# Patient Record
Sex: Female | Born: 1935 | Race: Black or African American | Hispanic: No | Marital: Married | Smoking: Former smoker
Health system: Southern US, Community
[De-identification: ages and names within clinical notes are randomized; demographics above are authoritative.]

## PROBLEM LIST (undated history)

## (undated) DIAGNOSIS — J45909 Unspecified asthma, uncomplicated: Secondary | ICD-10-CM

## (undated) HISTORY — DX: Unspecified asthma, uncomplicated: J45.909

## (undated) HISTORY — PX: KNEE SURGERY: SHX244

## (undated) HISTORY — PX: OTHER SURGICAL HISTORY: SHX169

## (undated) HISTORY — PX: ROTATOR CUFF REPAIR: SHX139

## (undated) HISTORY — PX: ABDOMINAL HYSTERECTOMY: SHX81

## (undated) HISTORY — PX: BREAST EXCISIONAL BIOPSY: SUR124

---

## 2011-05-01 ENCOUNTER — Other Ambulatory Visit: Payer: Self-pay | Admitting: Internal Medicine

## 2011-05-01 DIAGNOSIS — Z1231 Encounter for screening mammogram for malignant neoplasm of breast: Secondary | ICD-10-CM

## 2011-05-27 ENCOUNTER — Ambulatory Visit
Admission: RE | Admit: 2011-05-27 | Discharge: 2011-05-27 | Disposition: A | Discharge status: Home / Self Care | Payer: Medicare Other | Source: Ambulatory Visit | Attending: Internal Medicine | Admitting: Internal Medicine

## 2011-05-27 DIAGNOSIS — Z1231 Encounter for screening mammogram for malignant neoplasm of breast: Secondary | ICD-10-CM

## 2012-04-23 ENCOUNTER — Other Ambulatory Visit: Payer: Self-pay | Admitting: Internal Medicine

## 2012-04-23 DIAGNOSIS — Z1231 Encounter for screening mammogram for malignant neoplasm of breast: Secondary | ICD-10-CM

## 2012-05-27 ENCOUNTER — Ambulatory Visit
Admission: RE | Admit: 2012-05-27 | Discharge: 2012-05-27 | Disposition: A | Discharge status: Home / Self Care | Payer: Medicare Other | Source: Ambulatory Visit | Attending: Internal Medicine | Admitting: Internal Medicine

## 2012-05-27 DIAGNOSIS — Z1231 Encounter for screening mammogram for malignant neoplasm of breast: Secondary | ICD-10-CM

## 2013-04-22 ENCOUNTER — Other Ambulatory Visit: Payer: Self-pay

## 2013-04-22 DIAGNOSIS — Z1231 Encounter for screening mammogram for malignant neoplasm of breast: Secondary | ICD-10-CM

## 2013-05-30 ENCOUNTER — Ambulatory Visit
Admission: RE | Admit: 2013-05-30 | Discharge: 2013-05-30 | Disposition: A | Discharge status: Home / Self Care | Payer: Medicare Other | Source: Ambulatory Visit

## 2013-05-30 DIAGNOSIS — Z1231 Encounter for screening mammogram for malignant neoplasm of breast: Secondary | ICD-10-CM

## 2014-05-03 ENCOUNTER — Other Ambulatory Visit: Payer: Self-pay

## 2014-05-03 DIAGNOSIS — Z1231 Encounter for screening mammogram for malignant neoplasm of breast: Secondary | ICD-10-CM

## 2014-05-31 ENCOUNTER — Ambulatory Visit
Admission: RE | Admit: 2014-05-31 | Discharge: 2014-05-31 | Disposition: A | Discharge status: Home / Self Care | Payer: Medicare Other | Source: Ambulatory Visit

## 2014-05-31 DIAGNOSIS — Z1231 Encounter for screening mammogram for malignant neoplasm of breast: Secondary | ICD-10-CM

## 2015-05-04 ENCOUNTER — Other Ambulatory Visit: Payer: Self-pay

## 2015-05-04 DIAGNOSIS — Z1231 Encounter for screening mammogram for malignant neoplasm of breast: Secondary | ICD-10-CM

## 2015-06-04 ENCOUNTER — Ambulatory Visit
Admission: RE | Admit: 2015-06-04 | Discharge: 2015-06-04 | Disposition: A | Discharge status: Home / Self Care | Payer: Medicare Other | Source: Ambulatory Visit

## 2015-06-04 DIAGNOSIS — Z1231 Encounter for screening mammogram for malignant neoplasm of breast: Secondary | ICD-10-CM

## 2015-10-12 DIAGNOSIS — D509 Iron deficiency anemia, unspecified: Secondary | ICD-10-CM | POA: Diagnosis not present

## 2015-10-12 DIAGNOSIS — M810 Age-related osteoporosis without current pathological fracture: Secondary | ICD-10-CM | POA: Diagnosis not present

## 2015-10-12 DIAGNOSIS — I10 Essential (primary) hypertension: Secondary | ICD-10-CM | POA: Diagnosis not present

## 2015-10-22 DIAGNOSIS — E7800 Pure hypercholesterolemia, unspecified: Secondary | ICD-10-CM | POA: Diagnosis not present

## 2015-10-22 DIAGNOSIS — I10 Essential (primary) hypertension: Secondary | ICD-10-CM | POA: Diagnosis not present

## 2015-10-22 DIAGNOSIS — L299 Pruritus, unspecified: Secondary | ICD-10-CM | POA: Diagnosis not present

## 2015-10-22 DIAGNOSIS — D509 Iron deficiency anemia, unspecified: Secondary | ICD-10-CM | POA: Diagnosis not present

## 2015-10-22 DIAGNOSIS — R42 Dizziness and giddiness: Secondary | ICD-10-CM | POA: Diagnosis not present

## 2016-04-23 DIAGNOSIS — E7800 Pure hypercholesterolemia, unspecified: Secondary | ICD-10-CM | POA: Diagnosis not present

## 2016-04-23 DIAGNOSIS — I10 Essential (primary) hypertension: Secondary | ICD-10-CM | POA: Diagnosis not present

## 2016-04-23 DIAGNOSIS — Z23 Encounter for immunization: Secondary | ICD-10-CM | POA: Diagnosis not present

## 2016-04-23 DIAGNOSIS — D509 Iron deficiency anemia, unspecified: Secondary | ICD-10-CM | POA: Diagnosis not present

## 2016-04-23 DIAGNOSIS — M810 Age-related osteoporosis without current pathological fracture: Secondary | ICD-10-CM | POA: Diagnosis not present

## 2016-05-19 ENCOUNTER — Other Ambulatory Visit: Payer: Self-pay | Admitting: Internal Medicine

## 2016-05-19 DIAGNOSIS — Z1231 Encounter for screening mammogram for malignant neoplasm of breast: Secondary | ICD-10-CM

## 2016-06-04 ENCOUNTER — Ambulatory Visit
Admission: RE | Admit: 2016-06-04 | Discharge: 2016-06-04 | Disposition: A | Discharge status: Home / Self Care | Payer: Medicare Other | Source: Ambulatory Visit | Attending: Internal Medicine | Admitting: Internal Medicine

## 2016-06-04 DIAGNOSIS — Z1231 Encounter for screening mammogram for malignant neoplasm of breast: Secondary | ICD-10-CM | POA: Diagnosis not present

## 2016-09-09 ENCOUNTER — Encounter: Payer: Self-pay | Admitting: Family Medicine

## 2016-09-09 ENCOUNTER — Ambulatory Visit (INDEPENDENT_AMBULATORY_CARE_PROVIDER_SITE_OTHER): Payer: Medicare Other | Admitting: Family Medicine

## 2016-09-09 VITALS — BP 130/64 | HR 64 | Temp 98.2°F | Resp 16 | Ht 61.45 in | Wt 108.0 lb

## 2016-09-09 DIAGNOSIS — M150 Primary generalized (osteo)arthritis: Secondary | ICD-10-CM | POA: Diagnosis not present

## 2016-09-09 DIAGNOSIS — M810 Age-related osteoporosis without current pathological fracture: Secondary | ICD-10-CM

## 2016-09-09 DIAGNOSIS — E785 Hyperlipidemia, unspecified: Secondary | ICD-10-CM

## 2016-09-09 DIAGNOSIS — M8949 Other hypertrophic osteoarthropathy, multiple sites: Secondary | ICD-10-CM

## 2016-09-09 DIAGNOSIS — Z862 Personal history of diseases of the blood and blood-forming organs and certain disorders involving the immune mechanism: Secondary | ICD-10-CM

## 2016-09-09 DIAGNOSIS — M1990 Unspecified osteoarthritis, unspecified site: Secondary | ICD-10-CM

## 2016-09-09 DIAGNOSIS — I10 Essential (primary) hypertension: Secondary | ICD-10-CM

## 2016-09-09 DIAGNOSIS — E7800 Pure hypercholesterolemia, unspecified: Secondary | ICD-10-CM | POA: Diagnosis not present

## 2016-09-09 DIAGNOSIS — M159 Polyosteoarthritis, unspecified: Secondary | ICD-10-CM

## 2016-09-09 HISTORY — DX: Essential (primary) hypertension: I10

## 2016-09-09 HISTORY — DX: Age-related osteoporosis without current pathological fracture: M81.0

## 2016-09-09 HISTORY — DX: Personal history of diseases of the blood and blood-forming organs and certain disorders involving the immune mechanism: Z86.2

## 2016-09-09 HISTORY — DX: Unspecified osteoarthritis, unspecified site: M19.90

## 2016-09-09 HISTORY — DX: Hyperlipidemia, unspecified: E78.5

## 2016-09-09 LAB — CBC WITH DIFFERENTIAL/PLATELET
BASOS PCT: 1 %
Basophils Absolute: 43 cells/uL (ref 0–200)
EOS ABS: 43 {cells}/uL (ref 15–500)
Eosinophils Relative: 1 %
HCT: 35.1 % (ref 35.0–45.0)
Hemoglobin: 11.1 g/dL — ABNORMAL LOW (ref 12.0–15.0)
Lymphocytes Relative: 50 %
Lymphs Abs: 2150 cells/uL (ref 850–3900)
MCH: 26.4 pg — ABNORMAL LOW (ref 27.0–33.0)
MCHC: 31.6 g/dL — ABNORMAL LOW (ref 32.0–36.0)
MCV: 83.4 fL (ref 80.0–100.0)
MONOS PCT: 9 %
MPV: 10.1 fL (ref 7.5–12.5)
Monocytes Absolute: 387 cells/uL (ref 200–950)
Neutro Abs: 1677 cells/uL (ref 1500–7800)
Neutrophils Relative %: 39 %
PLATELETS: 247 10*3/uL (ref 140–400)
RBC: 4.21 MIL/uL (ref 3.80–5.10)
RDW: 14.8 % (ref 11.0–15.0)
WBC: 4.3 10*3/uL (ref 3.8–10.8)

## 2016-09-09 LAB — COMPREHENSIVE METABOLIC PANEL
ALK PHOS: 34 U/L (ref 33–130)
ALT: 9 U/L (ref 6–29)
AST: 21 U/L (ref 10–35)
Albumin: 4.5 g/dL (ref 3.6–5.1)
BUN: 12 mg/dL (ref 7–25)
CO2: 27 mmol/L (ref 20–31)
CREATININE: 0.67 mg/dL (ref 0.60–0.88)
Calcium: 9.7 mg/dL (ref 8.6–10.4)
Chloride: 102 mmol/L (ref 98–110)
GLUCOSE: 82 mg/dL (ref 70–99)
POTASSIUM: 4.2 mmol/L (ref 3.5–5.3)
SODIUM: 138 mmol/L (ref 135–146)
Total Bilirubin: 0.7 mg/dL (ref 0.2–1.2)
Total Protein: 7.3 g/dL (ref 6.1–8.1)

## 2016-09-09 LAB — LIPID PANEL
CHOL/HDL RATIO: 2.5 ratio (ref <5.0)
CHOLESTEROL: 244 mg/dL — AB (ref <200)
HDL: 98 mg/dL (ref >50)
LDL Cholesterol: 134 mg/dL — ABNORMAL HIGH (ref <100)
Triglycerides: 58 mg/dL (ref <150)
VLDL: 12 mg/dL (ref <30)

## 2016-09-09 MED ORDER — LISINOPRIL 5 MG PO TABS: 5.0000 mg | ORAL_TABLET | Freq: Every day | ORAL | 2 refills | Status: DC

## 2016-09-09 MED ORDER — PRAVASTATIN SODIUM 20 MG PO TABS: 20.0000 mg | ORAL_TABLET | Freq: Every day | ORAL | 2 refills | Status: DC

## 2016-09-09 NOTE — Assessment & Plan Note (Signed)
Blood pressure well controlled no change in medication 

## 2016-09-09 NOTE — Progress Notes (Signed)
   Subjective:    Patient ID: Alexandra Barber, female    DOB: [birth year], 83 y.o.   MRN: [id number]  Patient presents for New Patient (Initial Visit) Patient here to establish care. Presently followed [facility] Vinnie [facility]ristin [facility], she left the practice    She has history of hypertension which has been controlled on her current regimen lisinopril 5 mg, normal renal function in September creatinine 0.57 BUN 11 History of hyperlipidemia no history of heart disease she's currently on pravastatin, her last cholesterol check showed LDL 129 total cholesterol 409240 HDL 98 VLDL 13 , History of iron deficiency anemia her previous primary care provider was monitoring her iron and ferritin levels  September her last surgery on her iron was normal  Osteoarthritis- uses olive oil  and cinnamon, uses a Bone UP supplement ,has had surgery on right knee   Osteoporosis has been treated in the past she's currently on calcium and vitamin D  Immunizations- flu shot up-to-date  Prevnar 13 given on 04/23/2015 pneumococcal 23 given on 12/20/2012 She is a previous smoker quite Age 83  Mammogram- UTD last in Nov 2017   Review Of Systems:  GEN- denies fatigue, fever, weight loss,weakness, recent illness HEENT- denies eye drainage, change in vision, nasal discharge, CVS- denies chest pain, palpitations RESP- denies SOB, cough, wheeze ABD- denies N/V, change in stools, abd pain GU- denies dysuria, hematuria, dribbling, incontinence MSK- denies joint pain, muscle aches, injury Neuro- denies headache, dizziness, syncope, seizure activity       Objective:    BP 130/64   Pulse 64   Temp 98.2 F (36.8 C) (Oral)   Resp 16   Ht 5' 1.45" (1.561 m)   Wt 108 lb (49 kg)   SpO2 98%   BMI 20.11 kg/m  GEN- NAD, alert and oriented x3 HEENT- PERRL, EOMI, non injected sclera, pink conjunctiva, MMM, oropharynx clear Neck- Supple, no thyromegaly, no bruit  CVS- RRR, no  murmur RESP-CTAB ABD-NABS,soft,NT,ND EXT- No edema Pulses- Radial, DP- 2+        Assessment & Plan:   We discussed that she can stop getting mammograms at her age.   Problem List Items Addressed This Visit    Osteoporosis    Continue calcium and vitamin D she also stays active with dance      Relevant Medications   calcium-vitamin D (OSCAL WITH D) 500-200 MG-UNIT tablet   Osteoarthritis    Okay to continue her arthritis supplement      Hypertension    Blood pressure well controlled no change in medication      Relevant Medications   lisinopril (PRINIVIL,ZESTRIL) 5 MG tablet   pravastatin (PRAVACHOL) 20 MG tablet   Other Relevant Orders   CBC with Differential/Platelet   Comprehensive metabolic panel   Hyperlipidemia    Check lipid she is on low-dose statin drug      Relevant Medications   lisinopril (PRINIVIL,ZESTRIL) 5 MG tablet   pravastatin (PRAVACHOL) 20 MG tablet   Other Relevant Orders   Lipid panel   History of iron deficiency anemia      Note: This dictation was prepared with Dragon dictation along with smaller phrase technology. Any transcriptional errors that result from this process are unintentional.

## 2016-09-09 NOTE — Assessment & Plan Note (Signed)
Continue calcium and vitamin D she also stays active with dance

## 2016-09-09 NOTE — Assessment & Plan Note (Signed)
Check lipid she is on low-dose statin drug

## 2016-09-09 NOTE — Assessment & Plan Note (Signed)
Okay to continue her arthritis supplement

## 2016-09-09 NOTE — Patient Instructions (Signed)
F/U 6 months  We will call with lab results  

## 2017-03-13 ENCOUNTER — Encounter: Payer: Self-pay | Admitting: Family Medicine

## 2017-03-13 ENCOUNTER — Ambulatory Visit (INDEPENDENT_AMBULATORY_CARE_PROVIDER_SITE_OTHER): Payer: Medicare Other | Admitting: Family Medicine

## 2017-03-13 VITALS — BP 122/60 | HR 60 | Temp 98.4°F | Resp 16 | Ht 61.0 in | Wt 104.0 lb

## 2017-03-13 DIAGNOSIS — S29012D Strain of muscle and tendon of back wall of thorax, subsequent encounter: Secondary | ICD-10-CM

## 2017-03-13 NOTE — Patient Instructions (Addendum)
Use the muscle rub No heavy lifting  F/U December for Physical

## 2017-03-14 NOTE — Progress Notes (Signed)
   Subjective:    Patient ID: Alexandra Barber, female    DOB: [birth year], 83 y.o.   MRN: [id number]  Patient presents for L Shoulder Pain (x3 days- reports that she thinks she may have pulled shoulder, but has improved today)   Pt here with left shoulder blade pain, she was moving a blackboard on wheels Sunday. The brakes were still on put she dragged it without help. Felt fine until Tuesday then had pain on left shoulder blade, pain with raising arm left side, would pull in that same site. Started stetching and using olive oil with herbs but not much better and little pain. No chest pain, no SOB, able to do her regular activites now     Review Of Systems:  GEN- denies fatigue, fever, weight loss,weakness, recent illness HEENT- denies eye drainage, change in vision, nasal discharge, CVS- denies chest pain, palpitations RESP- denies SOB, cough, wheeze ABD- denies N/V, change in stools, abd pain GU- denies dysuria, hematuria, dribbling, incontinence MSK- +joint pain, muscle aches, injury Neuro- denies headache, dizziness, syncope, seizure activity       Objective:    BP 122/60   Pulse 60   Temp 98.4 F (36.9 C) (Oral)   Resp 16   Ht 5\' 1"  (1.549 m)   Wt 104 lb (47.2 kg)   SpO2 99%   BMI 19.65 kg/m  GEN- NAD, alert and oriented x3 HEENT- PERRL, EOMI, non injected sclera, pink conjunctiva, MMM, oropharynx clear Neck- Supple, good ROM CVS- RRR, no murmur RESP-CTAB MSK- Spine NT, rotator cuff in tact bilat, biceps in tact, mild TTP along left shoulder blade musculature, no winged scapula EXT- No edema Pulses- Radial  2+        Assessment & Plan:      Problem List Items Addressed This Visit    None    Visit Diagnoses    Muscle strain of left upper back, subsequent encounter    -  Primary   MSK pain, improved ROM and pain already, rotator cuff in tact,good ROM spine, no red flags, use topical anti-inflammatory vs aleve, call if not improving       Note: This  dictation was prepared with Dragon dictation along with smaller phrase technology. Any transcriptional errors that result from this process are unintentional.

## 2017-04-21 ENCOUNTER — Ambulatory Visit (INDEPENDENT_AMBULATORY_CARE_PROVIDER_SITE_OTHER): Payer: Medicare Other

## 2017-04-21 DIAGNOSIS — Z23 Encounter for immunization: Secondary | ICD-10-CM

## 2017-04-21 NOTE — Progress Notes (Signed)
Patient was seen in office for a flu vaccine. Patient received the vaccine in her right deltoid. Patient tolerated well

## 2017-05-01 ENCOUNTER — Other Ambulatory Visit: Payer: Self-pay | Admitting: Family Medicine

## 2017-05-01 DIAGNOSIS — Z1231 Encounter for screening mammogram for malignant neoplasm of breast: Secondary | ICD-10-CM

## 2017-06-05 ENCOUNTER — Ambulatory Visit
Admission: RE | Admit: 2017-06-05 | Discharge: 2017-06-05 | Disposition: A | Discharge status: Home / Self Care | Payer: Medicare Other | Source: Ambulatory Visit | Attending: Family Medicine | Admitting: Family Medicine

## 2017-06-05 ENCOUNTER — Ambulatory Visit: Payer: Medicare Other

## 2017-06-05 DIAGNOSIS — Z1231 Encounter for screening mammogram for malignant neoplasm of breast: Secondary | ICD-10-CM

## 2017-06-11 ENCOUNTER — Other Ambulatory Visit: Payer: Self-pay | Admitting: *Deleted

## 2017-06-11 MED ORDER — LISINOPRIL 5 MG PO TABS: 5.0000 mg | ORAL_TABLET | Freq: Every day | ORAL | 2 refills | Status: DC

## 2017-06-11 MED ORDER — PRAVASTATIN SODIUM 20 MG PO TABS: 20.0000 mg | ORAL_TABLET | Freq: Every day | ORAL | 2 refills | Status: DC

## 2017-07-14 ENCOUNTER — Encounter: Payer: Medicare Other | Admitting: Family Medicine

## 2017-07-16 ENCOUNTER — Ambulatory Visit (INDEPENDENT_AMBULATORY_CARE_PROVIDER_SITE_OTHER): Payer: Medicare Other | Admitting: Family Medicine

## 2017-07-16 ENCOUNTER — Encounter: Payer: Self-pay | Admitting: Family Medicine

## 2017-07-16 ENCOUNTER — Other Ambulatory Visit: Payer: Self-pay

## 2017-07-16 VITALS — BP 114/70 | HR 64 | Temp 97.9°F | Resp 18 | Ht 61.0 in | Wt 106.2 lb

## 2017-07-16 DIAGNOSIS — Z862 Personal history of diseases of the blood and blood-forming organs and certain disorders involving the immune mechanism: Secondary | ICD-10-CM

## 2017-07-16 DIAGNOSIS — E782 Mixed hyperlipidemia: Secondary | ICD-10-CM | POA: Diagnosis not present

## 2017-07-16 DIAGNOSIS — Z0000 Encounter for general adult medical examination without abnormal findings: Secondary | ICD-10-CM

## 2017-07-16 DIAGNOSIS — I10 Essential (primary) hypertension: Secondary | ICD-10-CM

## 2017-07-16 DIAGNOSIS — H6121 Impacted cerumen, right ear: Secondary | ICD-10-CM

## 2017-07-16 NOTE — Patient Instructions (Signed)
F/U 6 months

## 2017-07-16 NOTE — Progress Notes (Signed)
Subjective:   Patient presents for Medicare Annual/Subsequent preventive examination.    Hyperlipidemia- has cut out sugar/desserts, to help get cholesterol down taking pravastatin   HTN- taking lisinopril without difficulty    Osteoporosis- taking calcium/vitamin D   Review Past Medical/Family/Social: Per EMR   Risk Factors  Current exercise habits: Dance, exercise Dietary issues discussed:   Cardiac risk factors: Obesity (BMI >= 30 kg/m2).   Depression Screen  (Note: if answer to either of the following is "Yes", a more complete depression screening is indicated)  Over the past two weeks, have you felt down, depressed or hopeless? No Over the past two weeks, have you felt little interest or pleasure in doing things? No Have you lost interest or pleasure in daily life? No Do you often feel hopeless? No Do you cry easily over simple problems? No   Activities of Daily Living  In your present state of health, do you have any difficulty performing the following activities?:  Driving? No  Managing money? No  Feeding yourself? No  Getting from bed to chair? No  Climbing a flight of stairs? No  Preparing food and eating?: No  Bathing or showering? No  Getting dressed: No  Getting to the toilet? No  Using the toilet:No  Moving around from place to place: No  In the past year have you fallen or had a near fall?:No  Are you sexually active? No  Do you have more than one partner? No   Hearing Difficulties: No  Do you often ask people to speak up or repeat themselves? No  Do you experience ringing or noises in your ears? No Do you have difficulty understanding soft or whispered voices? No  Do you feel that you have a problem with memory? No Do you often misplace items?sometimes   Do you feel safe at home? Yes  Cognitive Testing  Alert? Yes Normal Appearance?Yes  Oriented to person? Yes Place? Yes  Time? Yes  Recall of three objects? Yes  Can perform simple calculations?  Yes  Displays appropriate judgment?Yes  Can read the correct time from a watch face?Yes   List the Names of Other Physician/Practitioners you currently use:   [facility] - [place] [place]    Screening Tests / Date Colonoscopy Over age                      Zostavax - Declines due to cost  Mammogram  UTD Influenza Vaccine UTD Pneumonia- UTD Tetanus/tdapDeclines   ROS: GEN- denies fatigue, fever, weight loss,weakness, recent illness HEENT- denies eye drainage, change in vision, nasal discharge, CVS- denies chest pain, palpitations RESP- denies SOB, cough, wheeze ABD- denies N/V, change in stools, abd pain GU- denies dysuria, hematuria, dribbling, incontinence MSK- denies joint pain, muscle aches, injury Neuro- denies headache, dizziness, syncope, seizure activity  Physical: GEN- NAD, alert and oriented x3 HEENT- PERRL, EOMI, non injected sclera, pink conjunctiva, MMM, oropharynx clear, Rihht cerumen impaction Neck- Supple, no thryomegaly, no bruit  CVS- RRR, no murmur RESP-CTAB ABD-NABS,soft,NT,ND EXT- No edema Pulses- Radial, DP- 2+    Assessment:    Annual wellness medicare exam   Plan:    During the course of the visit the patient was educated and counseled about appropriate screening and preventive services including:  Screening mammography  UTD Colorectal cancer screening  Shingles vaccine. - will check on cost  Working on getting her Advanced Directives getting notarized will bring me a copy   Cerumen impaction- s/p  irrigation at bedside   Doing well overall  HTN- controlled Hyperlipidemia- check labs on statin drug   Weight is stable  Diet review for nutrition referral? Yes ____ Not Indicated __x__  Patient Instructions (the written plan) was given to the patient.  Medicare Attestation  I have personally reviewed:  The patient's medical and social history  Their use of alcohol, tobacco or illicit drugs  Their current medications and  supplements  The patient's functional ability including ADLs,fall risks, home safety risks, cognitive, and hearing and visual impairment  Diet and physical activities  Evidence for depression or mood disorders  The patient's weight, height, BMI, and visual acuity have been recorded in the chart. I have made referrals, counseling, and provided education to the patient based on review of the above and I have provided the patient with a written personalized care plan for preventive services.

## 2017-07-17 LAB — CBC WITH DIFFERENTIAL/PLATELET
BASOS PCT: 1.4 %
Basophils Absolute: 49 cells/uL (ref 0–200)
EOS ABS: 81 {cells}/uL (ref 15–500)
Eosinophils Relative: 2.3 %
HCT: 33 % — ABNORMAL LOW (ref 35.0–45.0)
HEMOGLOBIN: 10.8 g/dL — AB (ref 11.7–15.5)
Lymphs Abs: 1414 cells/uL (ref 850–3900)
MCH: 27 pg (ref 27.0–33.0)
MCHC: 32.7 g/dL (ref 32.0–36.0)
MCV: 82.5 fL (ref 80.0–100.0)
MONOS PCT: 12.4 %
MPV: 11.5 fL (ref 7.5–12.5)
NEUTROS ABS: 1523 {cells}/uL (ref 1500–7800)
Neutrophils Relative %: 43.5 %
PLATELETS: 232 10*3/uL (ref 140–400)
RBC: 4 10*6/uL (ref 3.80–5.10)
RDW: 13.2 % (ref 11.0–15.0)
Total Lymphocyte: 40.4 %
WBC: 3.5 10*3/uL — ABNORMAL LOW (ref 3.8–10.8)
WBCMIX: 434 {cells}/uL (ref 200–950)

## 2017-07-17 LAB — COMPREHENSIVE METABOLIC PANEL
AG Ratio: 1.5 (calc) (ref 1.0–2.5)
ALKALINE PHOSPHATASE (APISO): 35 U/L (ref 33–130)
ALT: 9 U/L (ref 6–29)
AST: 18 U/L (ref 10–35)
Albumin: 4.3 g/dL (ref 3.6–5.1)
BUN: 12 mg/dL (ref 7–25)
CALCIUM: 9.8 mg/dL (ref 8.6–10.4)
CO2: 27 mmol/L (ref 20–32)
Chloride: 101 mmol/L (ref 98–110)
Creat: 0.65 mg/dL (ref 0.60–0.88)
Globulin: 2.9 g/dL (calc) (ref 1.9–3.7)
Glucose, Bld: 85 mg/dL (ref 65–99)
Potassium: 4.1 mmol/L (ref 3.5–5.3)
Sodium: 137 mmol/L (ref 135–146)
Total Bilirubin: 0.6 mg/dL (ref 0.2–1.2)
Total Protein: 7.2 g/dL (ref 6.1–8.1)

## 2017-07-17 LAB — LIPID PANEL
CHOLESTEROL: 244 mg/dL — AB (ref <200)
HDL: 95 mg/dL (ref >50)
LDL Cholesterol (Calc): 133 mg/dL (calc) — ABNORMAL HIGH
Non-HDL Cholesterol (Calc): 149 mg/dL (calc) — ABNORMAL HIGH (ref <130)
TRIGLYCERIDES: 64 mg/dL (ref <150)
Total CHOL/HDL Ratio: 2.6 (calc) (ref <5.0)

## 2017-07-20 ENCOUNTER — Other Ambulatory Visit: Payer: Self-pay | Admitting: *Deleted

## 2017-07-20 MED ORDER — PRAVASTATIN SODIUM 40 MG PO TABS: 40.0000 mg | ORAL_TABLET | Freq: Every day | ORAL | 3 refills | Status: DC

## 2018-03-22 ENCOUNTER — Other Ambulatory Visit: Payer: Self-pay | Admitting: Family Medicine

## 2018-05-05 ENCOUNTER — Ambulatory Visit (INDEPENDENT_AMBULATORY_CARE_PROVIDER_SITE_OTHER): Payer: Medicare Other | Admitting: Family Medicine

## 2018-05-05 DIAGNOSIS — Z23 Encounter for immunization: Secondary | ICD-10-CM

## 2018-05-10 ENCOUNTER — Other Ambulatory Visit: Payer: Self-pay | Admitting: Family Medicine

## 2018-05-10 DIAGNOSIS — Z1231 Encounter for screening mammogram for malignant neoplasm of breast: Secondary | ICD-10-CM

## 2018-06-10 ENCOUNTER — Ambulatory Visit
Admission: RE | Admit: 2018-06-10 | Discharge: 2018-06-10 | Disposition: A | Discharge status: Home / Self Care | Payer: Medicare Other | Source: Ambulatory Visit | Attending: Family Medicine | Admitting: Family Medicine

## 2018-06-10 DIAGNOSIS — Z1231 Encounter for screening mammogram for malignant neoplasm of breast: Secondary | ICD-10-CM

## 2018-07-02 ENCOUNTER — Other Ambulatory Visit: Payer: Self-pay | Admitting: Family Medicine

## 2018-08-20 DIAGNOSIS — Z0120 Encounter for dental examination and cleaning without abnormal findings: Secondary | ICD-10-CM | POA: Diagnosis not present

## 2018-09-15 ENCOUNTER — Ambulatory Visit (INDEPENDENT_AMBULATORY_CARE_PROVIDER_SITE_OTHER): Payer: Medicare Other | Admitting: Family Medicine

## 2018-09-15 ENCOUNTER — Encounter: Payer: Self-pay | Admitting: Family Medicine

## 2018-09-15 VITALS — BP 110/70 | HR 62 | Temp 97.8°F | Resp 18 | Ht 61.0 in | Wt 110.0 lb

## 2018-09-15 DIAGNOSIS — I10 Essential (primary) hypertension: Secondary | ICD-10-CM

## 2018-09-15 DIAGNOSIS — M810 Age-related osteoporosis without current pathological fracture: Secondary | ICD-10-CM | POA: Diagnosis not present

## 2018-09-15 DIAGNOSIS — Z0000 Encounter for general adult medical examination without abnormal findings: Secondary | ICD-10-CM

## 2018-09-15 DIAGNOSIS — E782 Mixed hyperlipidemia: Secondary | ICD-10-CM | POA: Diagnosis not present

## 2018-09-15 MED ORDER — PRAVASTATIN SODIUM 40 MG PO TABS: 40.0000 mg | ORAL_TABLET | Freq: Every day | ORAL | 2 refills | Status: DC

## 2018-09-15 MED ORDER — LISINOPRIL 2.5 MG PO TABS: 5.0000 mg | ORAL_TABLET | Freq: Every day | ORAL | 2 refills | Status: DC

## 2018-09-15 NOTE — Progress Notes (Signed)
Subjective:   Patient presents for Medicare Annual/Subsequent preventive examination.   She here for wellness exam.  She has no specific concerns.  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  She continues to dance for exercise.  She is taking her vitamins as well as her blood pressure and cholesterol medication. Review Past Medical/Family/Social: Per EMR   Risk Factors  Current exercise habits: Regular exercise and dance Dietary issues discussed: No concerns  Cardiac risk factors: Hypertension, hyperlipidemia  Depression Screen  (Note: if answer to either of the following is "Yes", a more complete depression screening is indicated)  Over the past two weeks, have you felt down, depressed or hopeless? No Over the past two weeks, have you felt little interest or pleasure in doing things? No Have you lost interest or pleasure in daily life? No Do you often feel hopeless? No Do you cry easily over simple problems? No   Activities of Daily Living  In your present state of health, do you have any difficulty performing the following activities?:  Driving? No  Managing money? No  Feeding yourself? No  Getting from bed to chair? No  Climbing a flight of stairs? No  Preparing food and eating?: No  Bathing or showering? No  Getting dressed: No  Getting to the toilet? No  Using the toilet:No  Moving around from place to place: No  In the past year have you fallen or had a near fall?:No  Are you sexually active? No  Do you have more than one partner? No   Hearing Difficulties: No  Do you often ask people to speak up or repeat themselves? No  Do you experience ringing or noises in your ears? No Do you have difficulty understanding soft or whispered voices? No  Do you feel that you have a problem with memory? No Do you often misplace items? No  Do you feel safe at home? Yes  Cognitive Testing  Alert? Yes Normal Appearance?Yes   Oriented to person? Yes Place? Yes  Time? Yes  Recall of three objects? Yes  Can perform simple calculations? Yes  Displays appropriate judgment?Yes  Can read the correct time from a watch face?Yes   List the Names of Other Physician/Practitioners you currently use:   Dr. [place]- Eye doctor   Screening Tests / Date Colonoscopy Over age                      Zostavax - Declines due to cost  Mammogram  UTD Influenza Vaccine UTD Pneumonia- UTD Tetanus/tdapDeclines   ROS: GEN- denies fatigue, fever, weight loss,weakness, recent illness HEENT- denies eye drainage, change in vision, nasal discharge, CVS- denies chest pain, palpitations RESP- denies SOB, cough, wheeze ABD- denies N/V, change in stools, abd pain GU- denies dysuria, hematuria, dribbling, incontinence MSK- denies joint pain, muscle aches, injury Neuro- denies headache, dizziness, syncope, seizure activity  Physical: GEN- NAD, alert and oriented x3 HEENT- PERRL, EOMI, non injected sclera, pink conjunctiva, MMM, oropharynx clear, Neck- Supple, no thryomegaly, no bruit  CVS- RRR, no murmur RESP-CTAB ABD-NABS,soft,NT,ND EXT- No edema Pulses- Radial, DP- 2+   Assessment:    Annual wellness medicare exam   Plan:    During the course of the visit the patient was educated and counseled about appropriate screening and preventive services including:  Screening mammography up-to-date we discussed that she can do this every 2 years.  Her mother did have breast  cancer in her 90s. Bone density she has known osteoporosis however stays active we have not been rechecking bone density she stays on calcium and vitamin D did not see any reason to recheck at this time.  Prevention up-to-date for her age declines Tdap and shingles due to cost  Hypertension her blood pressure is low normal with her age we will try to prevent hypotensive so we will decrease her lisinopril to 2.5 mg once a day she is gone monitor at home.  We  will likely be able to discontinue this altogether.  Hyperlipidemia continue pravastatin we will check her fasting labs today  Audit C/fall/depression screen negative          Diet review for nutrition referral? Yes ____ Not Indicated __x__  Patient Instructions (the written plan) was given to the patient.  Medicare Attestation  I have personally reviewed:  The patient's medical and social history  Their use of alcohol, tobacco or illicit drugs  Their current medications and supplements  The patient's functional ability including ADLs,fall risks, home safety risks, cognitive, and hearing and visual impairment  Diet and physical activities  Evidence for depression or mood disorders  The patient's weight, height, BMI, and visual acuity have been recorded in the chart. I have made referrals, counseling, and provided education to the patient based on review of the above and I have provided the patient with a written personalized care plan for preventive services.

## 2018-09-15 NOTE — Patient Instructions (Signed)
F/u 1 YEAR for physical Decrease the lisinopril to 2.5mg  once a day- take 1/2 tablet of the medication you have until you run out

## 2018-09-16 LAB — COMPREHENSIVE METABOLIC PANEL
AG Ratio: 1.5 (calc) (ref 1.0–2.5)
ALT: 10 U/L (ref 6–29)
AST: 20 U/L (ref 10–35)
Albumin: 4.3 g/dL (ref 3.6–5.1)
Alkaline phosphatase (APISO): 36 U/L — ABNORMAL LOW (ref 37–153)
BILIRUBIN TOTAL: 0.8 mg/dL (ref 0.2–1.2)
BUN: 12 mg/dL (ref 7–25)
CALCIUM: 10.1 mg/dL (ref 8.6–10.4)
CO2: 25 mmol/L (ref 20–32)
Chloride: 102 mmol/L (ref 98–110)
Creat: 0.62 mg/dL (ref 0.60–0.88)
Globulin: 2.9 g/dL (calc) (ref 1.9–3.7)
Glucose, Bld: 73 mg/dL (ref 65–99)
Potassium: 4.1 mmol/L (ref 3.5–5.3)
Sodium: 138 mmol/L (ref 135–146)
Total Protein: 7.2 g/dL (ref 6.1–8.1)

## 2018-09-16 LAB — LIPID PANEL
Cholesterol: 226 mg/dL — ABNORMAL HIGH (ref <200)
HDL: 75 mg/dL (ref >=50)
LDL CHOLESTEROL (CALC): 136 mg/dL — AB
Non-HDL Cholesterol (Calc): 151 mg/dL (calc) — ABNORMAL HIGH (ref <130)
Total CHOL/HDL Ratio: 3 (calc) (ref <5.0)
Triglycerides: 60 mg/dL (ref <150)

## 2018-09-16 LAB — CBC WITH DIFFERENTIAL/PLATELET
Absolute Monocytes: 544 cells/uL (ref 200–950)
BASOS PCT: 1.2 %
Basophils Absolute: 59 cells/uL (ref 0–200)
Eosinophils Absolute: 49 cells/uL (ref 15–500)
Eosinophils Relative: 1 %
HEMATOCRIT: 32.8 % — AB (ref 35.0–45.0)
Hemoglobin: 10.8 g/dL — ABNORMAL LOW (ref 11.7–15.5)
Lymphs Abs: 2274 cells/uL (ref 850–3900)
MCH: 26.9 pg — ABNORMAL LOW (ref 27.0–33.0)
MCHC: 32.9 g/dL (ref 32.0–36.0)
MCV: 81.8 fL (ref 80.0–100.0)
MONOS PCT: 11.1 %
MPV: 11.4 fL (ref 7.5–12.5)
NEUTROS ABS: 1975 {cells}/uL (ref 1500–7800)
Neutrophils Relative %: 40.3 %
Platelets: 259 10*3/uL (ref 140–400)
RBC: 4.01 10*6/uL (ref 3.80–5.10)
RDW: 13.2 % (ref 11.0–15.0)
TOTAL LYMPHOCYTE: 46.4 %
WBC: 4.9 10*3/uL (ref 3.8–10.8)

## 2018-09-28 ENCOUNTER — Ambulatory Visit (INDEPENDENT_AMBULATORY_CARE_PROVIDER_SITE_OTHER): Payer: Medicare Other | Admitting: Family Medicine

## 2018-09-28 ENCOUNTER — Other Ambulatory Visit: Payer: Self-pay

## 2018-09-28 ENCOUNTER — Encounter: Payer: Self-pay | Admitting: Family Medicine

## 2018-09-28 VITALS — BP 128/62 | HR 64 | Temp 98.6°F | Resp 16 | Ht 61.0 in | Wt 110.8 lb

## 2018-09-28 DIAGNOSIS — E782 Mixed hyperlipidemia: Secondary | ICD-10-CM | POA: Diagnosis not present

## 2018-09-28 MED ORDER — PRAVASTATIN SODIUM 40 MG PO TABS: 40.0000 mg | ORAL_TABLET | Freq: Every day | ORAL | 2 refills | Status: DC

## 2018-09-28 NOTE — Assessment & Plan Note (Signed)
After further discussion.  She is can restart her exercise.  We will keep the pravastatin at 40 mg once a day.  We discussed dietary changes which in general she eats very healthy so I do not think that there is much to change in general.  I think that she does have a lifespan greater than 5 years just based on her family history and how she takes care of herself therefore warrants being on statin drug at this time.  She wants to recheck this in 6 months which I think is very reasonable.

## 2018-09-28 NOTE — Progress Notes (Signed)
   Subjective:    Patient ID: Alexandra Barber, female    DOB: 03/25/1932, 85 y.o.   MRN: [id number]  Patient presents for Medication Review/ Refill (pravastatin, discuss diet)  Pt here to discuss her recent labs from wellness labs. 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  This actually has her very upset and sad at times.  This is contributed to why her cholesterol has not gone down.  She does not want to increase the pravastatin.  Total cholesterol 226 LDL 136  Review Of Systems:  GEN- denies fatigue, fever, weight loss,weakness, recent illness HEENT- denies eye drainage, change in vision, nasal discharge, CVS- denies chest pain, palpitations RESP- denies SOB, cough, wheeze ABD- denies N/V, change in stools, abd pain GU- denies dysuria, hematuria, dribbling, incontinence MSK- denies joint pain, muscle aches, injury Neuro- denies headache, dizziness, syncope, seizure activity       Objective:    BP 128/62   Pulse 64   Temp 98.6 F (37 C) (Oral)   Resp 16   Ht 5\' 1"  (1.549 m)   Wt 110 lb 12.8 oz (50.3 kg)   SpO2 98%   BMI 20.94 kg/m  GEN- NAD, alert and oriented x3 HEENT- PERRL, EOMI, non injected sclera, pink conjunctiva, MMM, oropharynx clear CVS- RRR, no murmur RESP-CTAB        Assessment & Plan:      Problem List Items Addressed This Visit      Unprioritized   Hyperlipidemia - Primary    After further discussion.  She is can restart her exercise.  We will keep the pravastatin at 40 mg once a day.  We discussed dietary changes which in general she eats very healthy so I do not think that there is much to change in general.  I think that she does  have a lifespan greater than 5 years just based on her family history and how she takes care of herself therefore warrants being on statin drug at this time.  She wants to recheck this in 6 months which I think is very reasonable.      Relevant Medications   pravastatin (PRAVACHOL) 40 MG tablet      Note: This dictation was prepared with Dragon dictation along with smaller phrase technology. Any transcriptional errors that result from this process are unintentional.

## 2018-09-28 NOTE — Patient Instructions (Addendum)
Take 2 of the oscal Add Vitamin D3- 1000IU once a day  Fish oil 1000 once a day for cholesterol  F/U 6 months for cholesterol

## 2018-12-21 ENCOUNTER — Encounter: Payer: Self-pay | Admitting: Family Medicine

## 2018-12-21 ENCOUNTER — Ambulatory Visit (INDEPENDENT_AMBULATORY_CARE_PROVIDER_SITE_OTHER): Payer: Medicare Other | Admitting: Family Medicine

## 2018-12-21 ENCOUNTER — Other Ambulatory Visit: Payer: Self-pay

## 2018-12-21 VITALS — BP 122/60 | HR 74 | Temp 97.9°F | Resp 16 | Ht 61.0 in | Wt 111.0 lb

## 2018-12-21 DIAGNOSIS — L237 Allergic contact dermatitis due to plants, except food: Secondary | ICD-10-CM | POA: Diagnosis not present

## 2018-12-21 MED ORDER — PREDNISONE 20 MG PO TABS: ORAL_TABLET | ORAL | 0 refills | Status: DC

## 2018-12-21 NOTE — Patient Instructions (Signed)
You were given a prednisone shot ( Depo Medrol), this can cause jitterness, shakiness This was for allergic reaction/rash  I have also sent prednisone tablets to the pharmacy, take first thing in the AM If you have any problems with the medication please call  Your other option is to use benadryl  F/U as needed

## 2018-12-21 NOTE — Progress Notes (Signed)
   Subjective:    Patient ID: Alexandra Barber, female    DOB: 14-May-1932, 85 y.o.   MRN: [id number]  Patient presents for Rash (x3 weeks- poison ivy on R arm- used calamine and open areas resolved, but still has itching)   She was painting on the outside of her house and came in contact with poison IVY. She was also moving brush out side. 3 days later started with itching on right hand and up arm and broke out in blistering bumps and when blisters popped ad some peeling of hand Still has itching under her skin even though she does not have any new bumps along the right arm.  She also has itching still some redness and swelling in her right hand.  She did have a patch on her upper back but that is healing still has some mild itching in that area.  She has been using calamine lotion but this is not helping.   Not Review Of Systems:  GEN- denies fatigue, fever, weight loss,weakness, recent illness HEENT- denies eye drainage, change in vision, nasal discharge, CVS- denies chest pain, palpitations RESP- denies SOB, cough, wheeze ABD- denies N/V, change in stools, abd pain GU- denies dysuria, hematuria, dribbling, incontinence MSK- denies joint pain, muscle aches, injury Neuro- denies headache, dizziness, syncope, seizure activity       Objective:    BP 122/60   Pulse 74   Temp 97.9 F (36.6 C) (Oral)   Resp 16   Ht 5\' 1"  (1.549 m)   Wt 111 lb (50.3 kg)   SpO2 99%   BMI 20.97 kg/m  GEN- NAD, alert and oriented x3 HEENT- PERRL, EOMI, non injected sclera, pink conjunctiva, MMM, oropharynx clear CVS- RRR, no murmur RESP-CTAB Skin- hyperpigmented scabs on her arm and upper mid back, mild erythematous patch in palm with small blisters, scabs between 2nd and 3rd digit on thumb EXT- No edema Pulses- Radial, 2+        Assessment & Plan:      Problem List Items Addressed This Visit    None    Visit Diagnoses    Poison ivy dermatitis    -  Primary   Residual irritation and  itching esepcially in palms. Will give Depo Medrol and prednisone. I did speak to patient about steroids for she was given the injection.  I have been noted in her chart she had methylprednisolone listed as an allergy but no specific reaction.  She did receive a Depo-Medrol injection we had her sit for about 15 minutes she did not have any chest pain palpitation shortness of breath change in rash.  States that she felt fine.  States that she has had steroid shots and never had an issue.  I also sent over prednisone low-dose and gave her red flags about any reaction with this medication.      Note: This dictation was prepared with Dragon dictation along with smaller phrase technology. Any transcriptional errors that result from this process are unintentional.

## 2019-02-22 ENCOUNTER — Other Ambulatory Visit: Payer: Self-pay | Admitting: Family Medicine

## 2019-03-29 ENCOUNTER — Other Ambulatory Visit: Payer: Self-pay

## 2019-03-30 ENCOUNTER — Ambulatory Visit (INDEPENDENT_AMBULATORY_CARE_PROVIDER_SITE_OTHER): Payer: Medicare Other | Admitting: Family Medicine

## 2019-03-30 ENCOUNTER — Encounter: Payer: Self-pay | Admitting: Family Medicine

## 2019-03-30 VITALS — BP 124/66 | HR 76 | Temp 98.1°F | Resp 12 | Ht 61.0 in | Wt 107.0 lb

## 2019-03-30 DIAGNOSIS — Z23 Encounter for immunization: Secondary | ICD-10-CM

## 2019-03-30 DIAGNOSIS — I10 Essential (primary) hypertension: Secondary | ICD-10-CM | POA: Diagnosis not present

## 2019-03-30 DIAGNOSIS — E782 Mixed hyperlipidemia: Secondary | ICD-10-CM | POA: Diagnosis not present

## 2019-03-30 DIAGNOSIS — M810 Age-related osteoporosis without current pathological fracture: Secondary | ICD-10-CM

## 2019-03-30 MED ORDER — LISINOPRIL 2.5 MG PO TABS: 2.5000 mg | ORAL_TABLET | Freq: Every day | ORAL | 3 refills | Status: DC

## 2019-03-30 NOTE — Patient Instructions (Signed)
F/U 6 months for Physical  Flu shot given  

## 2019-03-30 NOTE — Assessment & Plan Note (Signed)
She stays very active with dance.  She is on calcium and vitamin D

## 2019-03-30 NOTE — Assessment & Plan Note (Signed)
Blood pressure is controlled.  At her age I think we can decrease back to 2.5 mg she felt better on this dose and her blood pressure was still controlled.  We will check her renal function today as well as her lipid panel.

## 2019-03-30 NOTE — Progress Notes (Signed)
   Subjective:    Patient ID: Alexandra Barber, female    DOB: 1931-09-15, 85 y.o.   MRN: [id number]  Patient presents for Follow-up (is fasting) and Injections (Flu)  HTN- taking lisinopril 2.5mg  for a few months we discussed back in February as her blood pressure was on the low end.  States that she felt good on the lower dose.  However the bottle stated 2 tablets I think it reverted back to the previous 5 mg instruction so she started taking 5 mg again a month ago and noticed the fatigue and not feeling as well again.  Also noted that her blood pressure was on the lower end,    Osteoporosis taking Vitamin D 2800IU total   Calcum 1000mg  once a day    Hyperlipidemia- taking pravastatin without any difficulty.  She is also on omega-3  Review Of Systems:  GEN- denies fatigue, fever, weight loss,weakness, recent illness HEENT- denies eye drainage, change in vision, nasal discharge, CVS- denies chest pain, palpitations RESP- denies SOB, cough, wheeze ABD- denies N/V, change in stools, abd pain GU- denies dysuria, hematuria, dribbling, incontinence MSK- denies joint pain, muscle aches, injury Neuro- denies headache, dizziness, syncope, seizure activity       Objective:    BP 124/66   Pulse 76   Temp 98.1 F (36.7 C) (Oral)   Resp 12   Ht 5\' 1"  (1.549 m)   Wt 107 lb (48.5 kg)   SpO2 98%   BMI 20.22 kg/m  GEN- NAD, alert and oriented x3 HEENT- PERRL, EOMI, non injected sclera, pink conjunctiva, MMM, oropharynx clear Neck- Supple, no thyromegaly CVS- RRR, no murmur RESP-CTAB ABD-NABS,soft,NT,ND EXT- No edema Pulses- Radial, DP- 2+        Assessment & Plan:      Problem List Items Addressed This Visit      Unprioritized   Hyperlipidemia   Relevant Medications   lisinopril (ZESTRIL) 2.5 MG tablet   Other Relevant Orders   Lipid panel   Hypertension - Primary    Blood pressure is controlled.  At her age I think we can decrease back to 2.5 mg she felt better on this  dose and her blood pressure was still controlled.  We will check her renal function today as well as her lipid panel.      Relevant Medications   lisinopril (ZESTRIL) 2.5 MG tablet   Other Relevant Orders   CBC with Differential/Platelet   Comprehensive metabolic panel   Osteoporosis    She stays very active with dance.  She is on calcium and vitamin D      Relevant Medications   calcium-vitamin D (OSCAL WITH D) 500-200 MG-UNIT tablet    Other Visit Diagnoses    Need for immunization against influenza       Relevant Orders   Flu Vaccine QUAD High Dose(Fluad) (Completed)      Note: This dictation was prepared with Dragon dictation along with smaller phrase technology. Any transcriptional errors that result from this process are unintentional.

## 2019-03-31 LAB — CBC WITH DIFFERENTIAL/PLATELET
Absolute Monocytes: 534 cells/uL (ref 200–950)
Basophils Absolute: 39 cells/uL (ref 0–200)
Basophils Relative: 0.8 %
Eosinophils Absolute: 78 cells/uL (ref 15–500)
Eosinophils Relative: 1.6 %
HCT: 33.5 % — ABNORMAL LOW (ref 35.0–45.0)
Hemoglobin: 10.6 g/dL — ABNORMAL LOW (ref 11.7–15.5)
Lymphs Abs: 2548 cells/uL (ref 850–3900)
MCH: 26.6 pg — ABNORMAL LOW (ref 27.0–33.0)
MCHC: 31.6 g/dL — ABNORMAL LOW (ref 32.0–36.0)
MCV: 84.2 fL (ref 80.0–100.0)
MPV: 10.7 fL (ref 7.5–12.5)
Monocytes Relative: 10.9 %
Neutro Abs: 1700 cells/uL (ref 1500–7800)
Neutrophils Relative %: 34.7 %
Platelets: 233 10*3/uL (ref 140–400)
RBC: 3.98 10*6/uL (ref 3.80–5.10)
RDW: 13.5 % (ref 11.0–15.0)
Total Lymphocyte: 52 %
WBC: 4.9 10*3/uL (ref 3.8–10.8)

## 2019-03-31 LAB — LIPID PANEL
Cholesterol: 226 mg/dL — ABNORMAL HIGH (ref <200)
HDL: 81 mg/dL (ref >=50)
LDL Cholesterol (Calc): 131 mg/dL (calc) — ABNORMAL HIGH
Non-HDL Cholesterol (Calc): 145 mg/dL (calc) — ABNORMAL HIGH (ref <130)
Total CHOL/HDL Ratio: 2.8 (calc) (ref <5.0)
Triglycerides: 58 mg/dL (ref <150)

## 2019-03-31 LAB — COMPREHENSIVE METABOLIC PANEL
AG Ratio: 1.7 (calc) (ref 1.0–2.5)
ALT: 8 U/L (ref 6–29)
AST: 19 U/L (ref 10–35)
Albumin: 4.6 g/dL (ref 3.6–5.1)
Alkaline phosphatase (APISO): 27 U/L — ABNORMAL LOW (ref 37–153)
BUN: 13 mg/dL (ref 7–25)
CO2: 23 mmol/L (ref 20–32)
Calcium: 10 mg/dL (ref 8.6–10.4)
Chloride: 105 mmol/L (ref 98–110)
Creat: 0.75 mg/dL (ref 0.60–0.88)
Globulin: 2.7 g/dL (calc) (ref 1.9–3.7)
Glucose, Bld: 84 mg/dL (ref 65–99)
Potassium: 4.1 mmol/L (ref 3.5–5.3)
Sodium: 138 mmol/L (ref 135–146)
Total Bilirubin: 0.6 mg/dL (ref 0.2–1.2)
Total Protein: 7.3 g/dL (ref 6.1–8.1)

## 2019-05-16 ENCOUNTER — Telehealth: Payer: Self-pay | Admitting: *Deleted

## 2019-05-16 ENCOUNTER — Other Ambulatory Visit: Payer: Self-pay | Admitting: Family Medicine

## 2019-05-16 DIAGNOSIS — Z1231 Encounter for screening mammogram for malignant neoplasm of breast: Secondary | ICD-10-CM

## 2019-05-16 NOTE — Telephone Encounter (Signed)
Received VM from patient.   States that she has read that statins can cause muscle pain and she has increased shoulder pain. States that she no longer wants to take the statin, but requested MD to advise if there is another medication that she can take.   MD please advise.

## 2019-05-16 NOTE — Telephone Encounter (Signed)
She can try taking three times a week, see if she notices any changes, but statin drug are the best for heart risk with cholesterol  Or She can try zetia 10mg  once a day

## 2019-05-17 NOTE — Telephone Encounter (Signed)
Spoke with patient and informed her of Dr. Dorian Heckle recommendations. Patient verbalized understanding and stated that she will take it 3 times per week.

## 2019-06-17 ENCOUNTER — Other Ambulatory Visit: Payer: Self-pay

## 2019-06-17 ENCOUNTER — Encounter: Payer: Self-pay | Admitting: Family Medicine

## 2019-06-17 ENCOUNTER — Ambulatory Visit (INDEPENDENT_AMBULATORY_CARE_PROVIDER_SITE_OTHER): Payer: Medicare Other | Admitting: Family Medicine

## 2019-06-17 VITALS — BP 118/64 | HR 62 | Temp 98.6°F | Resp 14 | Ht 61.0 in | Wt 108.6 lb

## 2019-06-17 DIAGNOSIS — M25511 Pain in right shoulder: Secondary | ICD-10-CM | POA: Diagnosis not present

## 2019-06-17 DIAGNOSIS — E782 Mixed hyperlipidemia: Secondary | ICD-10-CM | POA: Diagnosis not present

## 2019-06-17 MED ORDER — PRAVASTATIN SODIUM 20 MG PO TABS: 20.0000 mg | ORAL_TABLET | Freq: Every day | ORAL | 3 refills | Status: DC

## 2019-06-17 MED ORDER — OMEGA-3 500 MG PO CAPS: ORAL_CAPSULE | ORAL | 0 refills | Status: DC

## 2019-06-17 MED ORDER — MELOXICAM 7.5 MG PO TABS: 7.5000 mg | ORAL_TABLET | Freq: Every day | ORAL | 0 refills | Status: DC

## 2019-06-17 NOTE — Assessment & Plan Note (Signed)
Discussed her pravastatin.  She has not been taking the 40 mg daily she takes it every few days.  She is willing to take daily medication at a reduced dose.  We will change her to 20 mg once a day I think we will still get some benefit out of her statin use

## 2019-06-17 NOTE — Patient Instructions (Addendum)
Voltaren gel over the counter  Take mobic once a day for 2 weeks for inflammation Keep working on shoulder exercises  Call me if not improved and I will refer you to orthopedics  Pravastatin 20mg  every day  F/U as previous

## 2019-06-17 NOTE — Progress Notes (Signed)
   Subjective:    Patient ID: Alexandra Barber, female    DOB: [birth year], 85 y.o.   MRN: [id number]  Patient presents for R Shoulder Pain (x1 month- decreased ROM)  Pt here with right shoulder pain. She had been gardening with a tool to loosen up crab grass that cuaes her to have to twist into the ground. She noticed she was putting a lot of effort into gardening and noticed some shoulder pain a couple weeks afterwards . Only has pain when she goes to raise up to shoulder height laterally , or goes to pull up the covers No neck pain, no tingling or numbness in finger tips No chest pain, no SOB  She has not taken any meds    Review Of Systems:  GEN- denies fatigue, fever, weight loss,weakness, recent illness HEENT- denies eye drainage, change in vision, nasal discharge, CVS- denies chest pain, palpitations RESP- denies SOB, cough, wheeze ABD- denies N/V, change in stools, abd pain GU- denies dysuria, hematuria, dribbling, incontinence MSK- + joint pain, muscle aches, injury Neuro- denies headache, dizziness, syncope, seizure activity       Objective:    BP 118/64   Pulse 62   Temp 98.6 F (37 C) (Temporal)   Resp 14   Ht 5\' 1"  (1.549 m)   Wt 108 lb 9.6 oz (49.3 kg)   SpO2 98%   BMI 20.52 kg/m  GEN- NAD, alert and oriented x3 HEENT- PERRL, EOMI, non injected sclera, pink conjunctiva, MMM, oropharynx clear Neck- Supple, no thyromegaly, fair ROM C spine, NT  CVS- RRR, no murmur RESP-CTAB MSK- fair ROM RUE compared to left, decreased lateral raise, mild TTP Post shoulder right side, biceps in tact, rotator cuff grossly in tact  EXT- No edema Pulses- Radial  2+        Assessment & Plan:      Problem List Items Addressed This Visit      Unprioritized   Hyperlipidemia    Discussed her pravastatin.  She has not been taking the 40 mg daily she takes it every few days.  She is willing to take daily medication at a reduced dose.  We will change her to 20 mg once a day I  think we will still get some benefit out of her statin use       Relevant Medications   pravastatin (PRAVACHOL) 20 MG tablet    Other Visit Diagnoses    Acute pain of right shoulder    -  Primary   s/p overuse injury, fairly good ROM, will proceed with consertiave approach, take mobic daily x 2 weeks, ICE helped, topical voltaren, if not improved she will consider orthopedics and possible injection.  She does not like to take very much medication in general      Note: This dictation was prepared with Dragon dictation along with smaller phrase technology. Any transcriptional errors that result from this process are unintentional.

## 2019-06-29 ENCOUNTER — Other Ambulatory Visit: Payer: Self-pay

## 2019-06-29 ENCOUNTER — Ambulatory Visit
Admission: RE | Admit: 2019-06-29 | Discharge: 2019-06-29 | Disposition: A | Discharge status: Home / Self Care | Payer: Medicare Other | Source: Ambulatory Visit | Attending: Family Medicine | Admitting: Family Medicine

## 2019-06-29 DIAGNOSIS — Z1231 Encounter for screening mammogram for malignant neoplasm of breast: Secondary | ICD-10-CM

## 2019-07-04 ENCOUNTER — Telehealth: Payer: Self-pay | Admitting: *Deleted

## 2019-07-04 DIAGNOSIS — M25511 Pain in right shoulder: Secondary | ICD-10-CM

## 2019-07-04 NOTE — Telephone Encounter (Signed)
Okay to place referral

## 2019-07-04 NOTE — Telephone Encounter (Signed)
Received call from patient.    Reports that she continues to have pain in R arm/ shoulder.   Per chart notes, if arm does not improve, will refer to Ortho.   Ok to place referral?

## 2019-07-04 NOTE — Telephone Encounter (Signed)
Referral orders placed

## 2019-07-12 ENCOUNTER — Encounter: Payer: Self-pay | Admitting: Orthopaedic Surgery

## 2019-07-12 ENCOUNTER — Other Ambulatory Visit: Payer: Self-pay

## 2019-07-12 ENCOUNTER — Ambulatory Visit: Payer: Self-pay

## 2019-07-12 ENCOUNTER — Ambulatory Visit: Payer: Medicare Other | Admitting: Orthopaedic Surgery

## 2019-07-12 DIAGNOSIS — M25511 Pain in right shoulder: Secondary | ICD-10-CM

## 2019-07-12 DIAGNOSIS — G8929 Other chronic pain: Secondary | ICD-10-CM | POA: Diagnosis not present

## 2019-07-12 MED ORDER — BUPIVACAINE HCL 0.5 % IJ SOLN
3.0000 mL | INTRAMUSCULAR | Status: AC | PRN
  Administered 2019-07-12: 10:00:00 3 mL via INTRA_ARTICULAR

## 2019-07-12 MED ORDER — METHYLPREDNISOLONE ACETATE 40 MG/ML IJ SUSP
40.0000 mg | INTRAMUSCULAR | Status: AC | PRN
  Administered 2019-07-12: 40 mg via INTRA_ARTICULAR

## 2019-07-12 MED ORDER — LIDOCAINE HCL 1 % IJ SOLN
3.0000 mL | INTRAMUSCULAR | Status: AC | PRN
  Administered 2019-07-12: 3 mL

## 2019-07-12 NOTE — Progress Notes (Signed)
Office Visit Note   Patient: Alexandra Barber           Date of Birth: May 30, 1932           MRN: [id number] Visit Date: 07/12/2019              Requested by: Alycia Rossetti, MD [address] [place],  [place] [address] PCP: Alycia Rossetti, MD   Assessment & Plan: Visit Diagnoses:  1. Chronic right shoulder pain     Plan: Impression is right shoulder pain from overuse in the yard.  Suspect bursitis versus rotator cuff tendinitis.  Based on discussion patient would like to try subacromial injection at home exercises.  Patient instructed to follow-up in a couple weeks if she does not notice any improvement.  Questions encouraged and answered.  Follow-up as needed.  Follow-Up Instructions: No follow-ups on file.   Orders:  Orders Placed This Encounter  Procedures  . Large Joint Inj  . XR Shoulder Right   No orders of the defined types were placed in this encounter.     Procedures: Large Joint Inj: R subacromial bursa on 07/12/2019 10:10 AM Indications: pain Details: 22 G needle  Arthrogram: No  Medications: 3 mL lidocaine 1 %; 3 mL bupivacaine 0.5 %; 40 mg methylPREDNISolone acetate 40 MG/ML Outcome: tolerated well, no immediate complications Consent was given by the patient. Patient was prepped and draped in the usual sterile fashion.       Clinical Data: No additional findings.   Subjective: Chief Complaint  Patient presents with  . Right Shoulder - Pain    Patient is a 85 year old female who is right-hand dominant who comes in for evaluation of right shoulder pain for couple weeks.  She states that she was pruning a lot of crab grass with her garden tool and because the ground is hard and cold she had to exert quite a bit of pressure to do so.  She did this for about 30 minutes over the course of couple days and since then she has had significant right shoulder pain.  Denies any radicular symptoms.  Denies any injuries.  She states pain is worse with  lifting her arm above her head.   Review of Systems  Constitutional: Negative.   HENT: Negative.   Eyes: Negative.   Respiratory: Negative.   Cardiovascular: Negative.   Endocrine: Negative.   Musculoskeletal: Negative.   Neurological: Negative.   Hematological: Negative.   Psychiatric/Behavioral: Negative.   All other systems reviewed and are negative.    Objective: Vital Signs: There were no vitals taken for this visit.  Physical Exam Vitals signs and nursing note reviewed.  Constitutional:      Appearance: She is well-developed.  HENT:     Head: Normocephalic and atraumatic.  Neck:     Musculoskeletal: Neck supple.  Pulmonary:     Effort: Pulmonary effort is normal.  Abdominal:     Palpations: Abdomen is soft.  Skin:    General: Skin is warm.     Capillary Refill: Capillary refill takes less than 2 seconds.  Neurological:     Mental Status: She is alert and oriented to person, place, and time.  Psychiatric:        Behavior: Behavior normal.        Thought Content: Thought content normal.        Judgment: Judgment normal.     Ortho Exam Right shoulder exam shows painful passive active range of motion.  Positive impingement signs.  Rotator cuff testing grossly tacked with moderate pain. Specialty Comments:  No specialty comments available.  Imaging: Xr Shoulder Right  Result Date: 07/12/2019 No acute or structure abnormalities.  Moderate AC joint arthrosis.  Glenohumeral joint appears to be well preserved.    PMFS History: Patient Active Problem List   Diagnosis Date Noted  . Hyperlipidemia 09/09/2016  . Hypertension 09/09/2016  . Osteoarthritis 09/09/2016  . Osteoporosis 09/09/2016  . History of iron deficiency anemia 09/09/2016   Past Medical History:  Diagnosis Date  . Asthma     Family History  Problem Relation Age of Onset  . Cancer Mother     Past Surgical History:  Procedure Laterality Date  . ABDOMINAL HYSTERECTOMY    . BREAST  EXCISIONAL BIOPSY Right   . KNEE SURGERY    . ovary removed Bilateral    when age 34  . ROTATOR CUFF REPAIR Left    Social History   Occupational History  . Not on file  Tobacco Use  . Smoking status: Former Games developer  . Smokeless tobacco: Never Used  Substance and Sexual Activity  . Alcohol use: No  . Drug use: No  . Sexual activity: Not on file

## 2019-08-01 ENCOUNTER — Telehealth: Payer: Self-pay | Admitting: Orthopaedic Surgery

## 2019-08-01 NOTE — Telephone Encounter (Signed)
Okay for referral?

## 2019-08-01 NOTE — Telephone Encounter (Signed)
Patient called. Would like a referral for PT. Her call back number is [phone]

## 2019-08-02 NOTE — Telephone Encounter (Signed)
Yes, thanks

## 2019-08-03 ENCOUNTER — Other Ambulatory Visit: Payer: Self-pay

## 2019-08-03 DIAGNOSIS — M25511 Pain in right shoulder: Secondary | ICD-10-CM

## 2019-08-03 DIAGNOSIS — G8929 Other chronic pain: Secondary | ICD-10-CM

## 2019-08-03 NOTE — Telephone Encounter (Signed)
Referral made. Someone will call her to schedule appt.

## 2019-08-15 ENCOUNTER — Other Ambulatory Visit: Payer: Self-pay

## 2019-08-15 ENCOUNTER — Ambulatory Visit: Payer: Medicare Other | Attending: Physician Assistant

## 2019-08-15 DIAGNOSIS — M6281 Muscle weakness (generalized): Secondary | ICD-10-CM | POA: Diagnosis not present

## 2019-08-15 DIAGNOSIS — G8929 Other chronic pain: Secondary | ICD-10-CM | POA: Diagnosis not present

## 2019-08-15 DIAGNOSIS — M25511 Pain in right shoulder: Secondary | ICD-10-CM | POA: Diagnosis not present

## 2019-08-15 NOTE — Patient Instructions (Signed)
3 sets of 10 standing flexion /abduction /shoulder rolls  2x/day ROM to tolerance daily

## 2019-08-15 NOTE — Therapy (Signed)
[facility] [address] [place], [place], [address] Phone: [phone]   Fax:  [phone]  Physical Therapy Evaluation  Patient Details  Name: Alexandra Barber MRN: [id number] Date of Birth: 01-04-1932 Referring Provider (PT): Alexandra Barber  , MD   Encounter Date: 08/15/2019  PT End of Session - 08/15/19 1209    Visit Number  1    Number of Visits  16    Date for PT Re-Evaluation  10/07/19    Authorization Type  BCBS MCR    PT Start Time  1130    PT Stop Time  1215    PT Time Calculation (min)  45 min    Activity Tolerance  Patient tolerated treatment well;Patient limited by pain    Behavior During Therapy  [facility] for tasks assessed/performed       Past Medical History:  Diagnosis Date  . Asthma     Past Surgical History:  Procedure Laterality Date  . ABDOMINAL HYSTERECTOMY    . BREAST EXCISIONAL BIOPSY Right   . KNEE SURGERY    . ovary removed Bilateral    when age 13  . ROTATOR CUFF REPAIR Left     There were no vitals filed for this visit.   Subjective Assessment - 08/15/19 1139    Subjective  She reports RT shoulder pain. 3 months ago Shoulders became sore and could not move arm well to side.   Makes noise now.   IT is sore now.   Sometimes can lift overhead and sometimes catches and can't lift high. MD said she may have bursitis or tendonitis.   Cortisone injection did not help with lifting  of RT arm    Pertinent History  L  shoulder RTC repair    Limitations  House hold activities;Lifting   hair care pain   Diagnostic tests  xray: degenerative changes    Patient Stated Goals  Decrease pain and improve RT arm function.    Currently in Pain?  No/denies    Pain Location  Shoulder    Pain Orientation  Right    Pain Descriptors / Indicators  Sore   deep   Pain Type  Chronic pain    Pain Onset  More than a month ago    Pain Frequency  Intermittent    Aggravating Factors   doing hair. vacuum, , pushing items.     Pain Relieving Factors  stopping activity.         [facility] PT Assessment - 08/15/19 0001      Assessment   Medical Diagnosis  RT shoulder pain    Referring Provider (PT)  Alexandra Barber  , MD    Onset Date/Surgical Date  --   3 months ago   Hand Dominance  Right    Next MD Visit  AS needed    Prior Therapy  No      Precautions   Precautions  None      Restrictions   Weight Bearing Restrictions  No      Balance Screen   Has the patient fallen in the past 6 months  No      Prior Function   Level of Independence  Independent      Cognition   Overall Cognitive Status  Within Functional Limits for tasks assessed      Observation/Other Assessments   Focus on Therapeutic Outcomes (FOTO)   33% limited      ROM / Strength   AROM / PROM /  Strength  AROM;PROM;Strength      AROM   AROM Assessment Site  Shoulder    Right/Left Shoulder  Right;Left    Right Shoulder Flexion  120 Degrees    Right Shoulder ABduction  73 Degrees   pain   Right Shoulder Internal Rotation  45 Degrees   pain  reaching behind back 4 inches < than LT   Right Shoulder External Rotation  80 Degrees   pain   Left Shoulder Flexion  140 Degrees    Left Shoulder ABduction  141 Degrees    Left Shoulder Internal Rotation  65 Degrees    Left Shoulder External Rotation  90 Degrees      PROM   PROM Assessment Site  Shoulder    Right/Left Shoulder  Right      Strength   Strength Assessment Site  Shoulder    Right/Left Shoulder  Right                Objective measurements completed on examination: See above findings.      [facility] Adult PT Treatment/Exercise - 08/15/19 0001      Exercises   Exercises  Shoulder      Shoulder Exercises: Standing   Flexion  Both;10 reps    Flexion Limitations  3 sets for home    ABduction  Both;10 reps    ABduction Limitations  3 sets    Other Standing Exercises  shoulder rolls x 10 3 sets             PT Education - 08/15/19 1146    Education  Details  POC, HEP    Person(s) Educated  Patient    Methods  Explanation;Demonstration;Tactile cues;Verbal cues;Handout    Comprehension  Verbalized understanding;Returned demonstration       PT Short Term Goals - 08/15/19 1230      PT SHORT TERM GOAL #1   Title  She will be indpendent with initial hEP    Time  3    Period  Weeks    Status  New      PT SHORT TERM GOAL #2   Title  She will be able to lift RRt arm into abduciton to 140 degrees with 1-2 maax pain.    Time  4    Period  Weeks    Status  New      PT SHORT TERM GOAL #3   Title  She will  report abl eot lift 1-2# object to shelf at shoulder height at home. with no pain    Time  4    Period  Weeks    Status  New      PT SHORT TERM GOAL #4   Title  She will be ablle to reach behind back equal to LT    Time  4    Period  Weeks    Status  New        PT Long Term Goals - 08/15/19 1233      PT LONG TERM GOAL #1   Title  She will be able to do all HEP issued    Time  8    Period  Weeks    Status  New      PT LONG TERM GOAL #2   Title  She will be able to perform normal self care with 1-2 max pain    Time  8    Period  Weeks      PT LONG TERM GOAL #3  Title  she will be able to lift normal items at home into upper cabinets with 1-2 max pain    Time  8    Period  Weeks    Status  New      PT LONG TERM GOAL #4   Title  She will be able to dress putting on coat/shirt without increased effort or pain.    Time  8    Period  Weeks    Status  New      PT LONG TERM GOAL #5   Title  FOTO score with decr to 29% or less    Time  8    Period  Weeks    Status  New             Plan - 08/15/19 1210    Clinical Impression Statement  [name] presents with pain on lifting her arm into abduciton , reaching across body, reeaching behind back.   She is not able to lift items in these directions du to pain. Passively she is WNL with end range pain.   She should improve with skilled PT and HEP but at this  point cautious  as to prognosis    Personal Factors and Comorbidities  Age;Time since onset of injury/illness/exacerbation    Examination-Activity Limitations  Reach Overhead;Dressing;Hygiene/Grooming;Lift;Carry    Examination-Participation Restrictions  Cleaning;Meal Prep;Laundry    Stability/Clinical Decision Making  Stable/Uncomplicated    Clinical Decision Making  Low    Rehab Potential  Good    PT Frequency  2x / week    PT Duration  8 weeks    PT Treatment/Interventions  Iontophoresis 4mg /ml Dexamethasone;Moist Heat;Manual techniques;Therapeutic exercise;Therapeutic activities;Patient/family education;Taping;Passive range of motion    PT Next Visit Plan  Review HEP and expand as able .  Modalities as needed   isometrics versus band exercises.  Gentle stretching    PT Home Exercise Plan  Activ flexion  and abduction in painfree ROm , shoulder shrugs    Consulted and Agree with Plan of Care  Patient       Patient will benefit from skilled therapeutic intervention in order to improve the following deficits and impairments:  Pain, Decreased strength, Decreased activity tolerance, Impaired UE functional use  Visit Diagnosis: Chronic right shoulder pain  Muscle weakness (generalized)     Problem List Patient Active Problem List   Diagnosis Date Noted  . Hyperlipidemia 09/09/2016  . Hypertension 09/09/2016  . Osteoarthritis 09/09/2016  . Osteoporosis 09/09/2016  . History of iron deficiency anemia 09/09/2016    Darrel Hoover PT 08/15/2019, 12:50 PM  [facility] [address] [place], [place], [address] Phone: [phone]   Fax:  [phone]  Name: Alexandra Barber MRN: [id number] Date of Birth: [birth year]

## 2019-08-18 ENCOUNTER — Other Ambulatory Visit: Payer: Self-pay

## 2019-08-18 ENCOUNTER — Encounter: Payer: Self-pay | Admitting: Physical Therapy

## 2019-08-18 ENCOUNTER — Ambulatory Visit: Payer: Medicare Other | Admitting: Physical Therapy

## 2019-08-18 DIAGNOSIS — G8929 Other chronic pain: Secondary | ICD-10-CM | POA: Diagnosis not present

## 2019-08-18 DIAGNOSIS — M6281 Muscle weakness (generalized): Secondary | ICD-10-CM | POA: Diagnosis not present

## 2019-08-18 DIAGNOSIS — M25511 Pain in right shoulder: Secondary | ICD-10-CM | POA: Diagnosis not present

## 2019-08-18 NOTE — Therapy (Signed)
[place], [place], [address] Phone: [phone]   Fax:  [phone]  Physical Therapy Treatment  Patient Details  Name: Alexandra Barber MRN: [id number] Date of Birth: [birth year] Referring Provider (PT): Frankey Shown  , MD   Encounter Date: 08/18/2019  PT End of Session - 08/18/19 1105    Visit Number  2    Number of Visits  16    Date for PT Re-Evaluation  10/07/19    Authorization Type  BCBS MCR    PT Start Time  1100    PT Stop Time  1138    PT Time Calculation (min)  38 min       Past Medical History:  Diagnosis Date  . Asthma     Past Surgical History:  Procedure Laterality Date  . ABDOMINAL HYSTERECTOMY    . BREAST EXCISIONAL BIOPSY Right   . KNEE SURGERY    . ovary removed Bilateral    when age 55  . ROTATOR CUFF REPAIR Left     There were no vitals filed for this visit.                    [facility] Adult PT Treatment/Exercise - 08/18/19 0001      Self-Care   Self-Care  Other Self-Care Comments    Other Self-Care Comments   HEP instructions       Shoulder Exercises: Supine   Other Supine Exercises  supine cane series pressups, pullovers, ER , horizontal abduction       Shoulder Exercises: Sidelying   External Rotation  10 reps    ABduction  20 reps      Shoulder Exercises: Standing   Flexion  Both;10 reps    ABduction  Both;10 reps    ABduction Limitations  3 sets      Shoulder Exercises: Pulleys   Flexion  2 minutes    Flexion Limitations  seated    Scaption  1 minute    Scaption Limitations  standing       Shoulder Exercises: Isometric Strengthening   Other Isometric Exercises  10 sec x 5 4 way    at wall with towel     Modalities   Modalities  Cryotherapy;Moist Heat      Cryotherapy   Number Minutes Cryotherapy  6 Minutes   while pt instucted in HEP    Cryotherapy Location  Shoulder    Type of Cryotherapy  Ice pack             PT  Education - 08/18/19 1138    Education Details  HEP    Person(s) Educated  Patient    Methods  Explanation;Handout    Comprehension  Verbalized understanding       PT Short Term Goals - 08/15/19 1230      PT SHORT TERM GOAL #1   Title  She will be indpendent with initial hEP    Time  3    Period  Weeks    Status  New      PT SHORT TERM GOAL #2   Title  She will be able to lift RRt arm into abduciton to 140 degrees with 1-2 maax pain.    Time  4    Period  Weeks    Status  New      PT SHORT TERM GOAL #3   Title  She will  report abl eot lift 1-2# object to shelf at shoulder  height at home. with no pain    Time  4    Period  Weeks    Status  New      PT SHORT TERM GOAL #4   Title  She will be ablle to reach behind back equal to LT    Time  4    Period  Weeks    Status  New        PT Long Term Goals - 08/15/19 1233      PT LONG TERM GOAL #1   Title  She will be able to do all HEP issued    Time  8    Period  Weeks    Status  New      PT LONG TERM GOAL #2   Title  She will be able to perform normal self care with 1-2 max pain    Time  8    Period  Weeks      PT LONG TERM GOAL #3   Title  she will be able to lift normal items at home into upper cabinets with 1-2 max pain    Time  8    Period  Weeks    Status  New      PT LONG TERM GOAL #4   Title  She will be able to dress putting on coat/shirt without increased effort or pain.    Time  8    Period  Weeks    Status  New      PT LONG TERM GOAL #5   Title  FOTO score with decr to 29% or less    Time  8    Period  Weeks    Status  New            Plan - 08/18/19 1141    Clinical Impression Statement  Ms Kath reports improvement with HEP however continued difficulty with abduction reaching. Reviewed HEP and progressed with supine cane and isometrics. Updated HEP. No increased pain post session.    PT Next Visit Plan  Review HEP and expand as able .  Modalities as needed   review isometrics versus  band exercises.  Gentle stretching    PT Home Exercise Plan  Activ flexion  and abduction in painfree ROm , shoulder shrugs: isometrics and supine cane , Sidelying ER       Patient will benefit from skilled therapeutic intervention in order to improve the following deficits and impairments:  Pain, Decreased strength, Decreased activity tolerance, Impaired UE functional use  Visit Diagnosis: Chronic right shoulder pain  Muscle weakness (generalized)     Problem List Patient Active Problem List   Diagnosis Date Noted  . Hyperlipidemia 09/09/2016  . Hypertension 09/09/2016  . Osteoarthritis 09/09/2016  . Osteoporosis 09/09/2016  . History of iron deficiency anemia 09/09/2016    Sherrie Mustache , PTA 08/18/2019, 11:46 AM  [facility] [address] [place], [place], [address] Phone: [phone]   Fax:  [phone]  Name: Jalissa Heinzelman MRN: [id number] Date of Birth: 10-26-1931

## 2019-08-23 ENCOUNTER — Ambulatory Visit: Payer: Medicare Other

## 2019-08-23 ENCOUNTER — Other Ambulatory Visit: Payer: Self-pay

## 2019-08-23 DIAGNOSIS — M6281 Muscle weakness (generalized): Secondary | ICD-10-CM | POA: Diagnosis not present

## 2019-08-23 DIAGNOSIS — G8929 Other chronic pain: Secondary | ICD-10-CM

## 2019-08-23 DIAGNOSIS — M25511 Pain in right shoulder: Secondary | ICD-10-CM

## 2019-08-23 NOTE — Therapy (Signed)
[place], [place], [address] Phone: [phone]   Fax:  [phone]  Physical Therapy Treatment  Patient Details  Name: Alexandra Barber MRN: [id number] Date of Birth: Apr 84, 1933 Referring Provider (PT): Frankey Shown  , MD   Encounter Date: 08/23/2019  PT End of Session - 08/23/19 1039    Visit Number  3    Number of Visits  16    Date for PT Re-Evaluation  10/07/19    Authorization Type  BC[employer] MCR    PT Start Time  1000    PT Stop Time  1038    PT Time Calculation (min)  38 min    Activity Tolerance  Patient tolerated treatment well    Behavior During Therapy  [facility] for tasks assessed/performed       Past Medical History:  Diagnosis Date  . Asthma     Past Surgical History:  Procedure Laterality Date  . ABDOMINAL HYSTERECTOMY    . BREAST EXCISIONAL BIOPSY Right   . KNEE SURGERY    . ovary removed Bilateral    when age 86  . ROTATOR CUFF REPAIR Left     There were no vitals filed for this visit.  Subjective Assessment - 08/23/19 1003    Currently in Pain?  No/denies                       [facility] Adult PT Treatment/Exercise - 08/23/19 0001      Shoulder Exercises: Seated   External Rotation  Both;10 reps    Theraband Level (Shoulder External Rotation)  Level 2 (Red)    External Rotation Limitations  2 sets    Flexion  Right;Left;20 reps    Flexion Limitations  2 sets of 10    Abduction  Both;20 reps    ABduction Limitations  2 sets of 10      Shoulder Exercises: Standing   Internal Rotation  Right;10 reps    Theraband Level (Shoulder Internal Rotation)  Level 2 (Red)    Internal Rotation Limitations  2 sets    Extension  Right;20 reps    Theraband Level (Shoulder Extension)  Level 2 (Red)    Row  Right;20 reps    Theraband Level (Shoulder Row)  Level 2 (Red)      Shoulder Exercises: Pulleys   Flexion  2 minutes    Flexion Limitations  seated    Scaption  1 minute     Scaption Limitations  standing              PT Education - 08/23/19 1039    Education Details  HEP    Person(s) Educated  Patient    Methods  Explanation;Demonstration;Tactile cues;Verbal cues;Handout    Comprehension  Returned demonstration;Verbalized understanding       PT Short Term Goals - 08/15/19 1230      PT SHORT TERM GOAL #1   Title  She will be indpendent with initial hEP    Time  3    Period  Weeks    Status  New      PT SHORT TERM GOAL #2   Title  She will be able to lift RRt arm into abduciton to 140 degrees with 1-2 maax pain.    Time  4    Period  Weeks    Status  New      PT SHORT TERM GOAL #3   Title  She will  report abl  eot lift 1-2# object to shelf at shoulder height at home. with no pain    Time  4    Period  Weeks    Status  New      PT SHORT TERM GOAL #4   Title  She will be ablle to reach behind back equal to LT    Time  4    Period  Weeks    Status  New        PT Long Term Goals - 08/15/19 1233      PT LONG TERM GOAL #1   Title  She will be able to do all HEP issued    Time  8    Period  Weeks    Status  New      PT LONG TERM GOAL #2   Title  She will be able to perform normal self care with 1-2 max pain    Time  8    Period  Weeks      PT LONG TERM GOAL #3   Title  she will be able to lift normal items at home into upper cabinets with 1-2 max pain    Time  8    Period  Weeks    Status  New      PT LONG TERM GOAL #4   Title  She will be able to dress putting on coat/shirt without increased effort or pain.    Time  8    Period  Weeks    Status  New      PT LONG TERM GOAL #5   Title  FOTO score with decr to 29% or less    Time  8    Period  Weeks    Status  New            Plan - 08/23/19 1040    Clinical Impression Statement  She did well today having no pain with addition of band exercises    PT Treatment/Interventions  Iontophoresis 4mg /ml Dexamethasone;Moist Heat;Manual techniques;Therapeutic  exercise;Therapeutic activities;Patient/family education;Taping;Passive range of motion    PT Next Visit Plan  Review HEP and expand as able .  Modalities as needed   review isometrics versus band exercises.  Gentle stretching    PT Home Exercise Plan  Activ flexion  and abduction in painfree ROm , shoulder shrugs: isometrics and supine cane , Sidelying ER   standing red band row/extension/IR /ER    Consulted and Agree with Plan of Care  Patient       Patient will benefit from skilled therapeutic intervention in order to improve the following deficits and impairments:  Pain, Decreased strength, Decreased activity tolerance, Impaired UE functional use  Visit Diagnosis: Chronic right shoulder pain  Muscle weakness (generalized)     Problem List Patient Active Problem List   Diagnosis Date Noted  . Hyperlipidemia 09/09/2016  . Hypertension 09/09/2016  . Osteoarthritis 09/09/2016  . Osteoporosis 09/09/2016  . History of iron deficiency anemia 09/09/2016    11/07/2016  PT 08/23/2019, 10:41 AM  [facility] [address] [place], [place], [place] Phone: [phone]   Fax:  [phone]  Name: Alexandra Barber MRN: Greig Castilla Date of Birth: 09/84/1933

## 2019-08-23 NOTE — Patient Instructions (Signed)
Red band row, ER , IR ,  Extension Rt shoulder x 20 reps 2x/day cautioned to modify to avoid pain.

## 2019-08-25 ENCOUNTER — Other Ambulatory Visit: Payer: Self-pay

## 2019-08-25 ENCOUNTER — Ambulatory Visit: Payer: Medicare Other

## 2019-08-25 DIAGNOSIS — M6281 Muscle weakness (generalized): Secondary | ICD-10-CM

## 2019-08-25 DIAGNOSIS — G8929 Other chronic pain: Secondary | ICD-10-CM

## 2019-08-25 DIAGNOSIS — M25511 Pain in right shoulder: Secondary | ICD-10-CM | POA: Diagnosis not present

## 2019-08-25 NOTE — Therapy (Signed)
[facility] Outpatient Rehabilitation [facility] [address] [place], [place], [address] Phone: [phone]   Fax:  [phone]  Physical Therapy Treatment  Patient Details  Name: Alexandra Barber MRN: [id number] Date of Birth: 09-12-1931 Referring Provider (PT): Gershon Mussel  , MD   Encounter Date: 08/25/2019  PT End of Session - 08/25/19 1310    Visit Number  4    Number of Visits  16    Date for PT Re-Evaluation  10/07/19    Authorization Type  BCBS MCR    PT Start Time  1215    PT Stop Time  1255    PT Time Calculation (min)  40 min    Activity Tolerance  Patient tolerated treatment well    Behavior During Therapy  [facility] for tasks assessed/performed       Past Medical History:  Diagnosis Date  . Asthma     Past Surgical History:  Procedure Laterality Date  . ABDOMINAL HYSTERECTOMY    . BREAST EXCISIONAL BIOPSY Right   . KNEE SURGERY    . ovary removed Bilateral    when age 7  . ROTATOR CUFF REPAIR Left     There were no vitals filed for this visit.  Subjective Assessment - 08/25/19 1219    Subjective  Sore from band exercises    Currently in Pain?  Yes    Pain Score  2     Pain Location  Shoulder    Pain Orientation  Right    Pain Descriptors / Indicators  Sore    Pain Type  Chronic pain    Pain Onset  More than a month ago    Pain Frequency  Intermittent    Aggravating Factors   hair care , vacuum    Pain Relieving Factors  rest                       [facility] Adult PT Treatment/Exercise - 08/25/19 0001      Shoulder Exercises: Seated   External Rotation  Both;10 reps    Theraband Level (Shoulder External Rotation)  Level 2 (Red)    External Rotation Limitations  2 sets    Flexion  Right;Left;20 reps    Abduction  Both;20 reps      Shoulder Exercises: Standing   Internal Rotation  Right;15 reps    Theraband Level (Shoulder Internal Rotation)  Level 2 (Red)    Internal Rotation Limitations  2 sets    Extension   Right;20 reps    Theraband Level (Shoulder Extension)  Level 2 (Red)    Row  Right;20 reps    Theraband Level (Shoulder Row)  Level 2 (Red)    Other Standing Exercises  REveiwed above with yellow band  for option at home      Shoulder Exercises: Pulleys   Flexion  2 minutes    Flexion Limitations  seated    Scaption  1 minute    Scaption Limitations  seated      Reviewed all HEP       PT Education - 08/25/19 1306    Education Details  reviewed technique for exercse and maintaining scapula retraction and cepression. Varing the red and yellow band (issued). Taking a day to rest.    Person(s) Educated  Patient    Methods  Explanation    Comprehension  Verbalized understanding       PT Short Term Goals - 08/25/19 1303      PT  SHORT TERM GOAL #1   Title  She will be indpendent with initial hEP    Status  Achieved        PT Long Term Goals - 08/15/19 1233      PT LONG TERM GOAL #1   Title  She will be able to do all HEP issued    Time  8    Period  Weeks    Status  New      PT LONG TERM GOAL #2   Title  She will be able to perform normal self care with 1-2 max pain    Time  8    Period  Weeks      PT LONG TERM GOAL #3   Title  she will be able to lift normal items at home into upper cabinets with 1-2 max pain    Time  8    Period  Weeks    Status  New      PT LONG TERM GOAL #4   Title  She will be able to dress putting on coat/shirt without increased effort or pain.    Time  8    Period  Weeks    Status  New      PT LONG TERM GOAL #5   Title  FOTO score with decr to 29% or less    Time  8    Period  Weeks    Status  New            Plan - 08/25/19 1235    Clinical Impression Statement  She reported significant increase in functional use of RT arm. She is able to do hair care wih min to no pain.  She is doing more home tasks with no pain. She is able t pull her covers up now with no pain and push self up in bed from sitting without pain. May only  need 6-8 sessions    PT Treatment/Interventions  Iontophoresis 4mg /ml Dexamethasone;Moist Heat;Manual techniques;Therapeutic exercise;Therapeutic activities;Patient/family education;Taping;Passive range of motion    PT Next Visit Plan  Review HEP and expand as able .  Modalities as needed  .  Gentle stretching  measure ROM and Goals  AROM with 1-3# as tolerated    PT Home Exercise Plan  Activ flexion  and abduction in painfree ROm , shoulder shrugs: isometrics and supine cane , Sidelying ER   standing red band row/extension/IR /ER,  scapula retraction with depression with and without bands    Consulted and Agree with Plan of Care  Patient       Patient will benefit from skilled therapeutic intervention in order to improve the following deficits and impairments:  Pain, Decreased strength, Decreased activity tolerance, Impaired UE functional use  Visit Diagnosis: Chronic right shoulder pain  Muscle weakness (generalized)     Problem List Patient Active Problem List   Diagnosis Date Noted  . Hyperlipidemia 09/09/2016  . Hypertension 09/09/2016  . Osteoarthritis 09/09/2016  . Osteoporosis 09/09/2016  . History of iron deficiency anemia 09/09/2016    Darrel Hoover  PT 08/25/2019, 1:21 PM  [facility] [address] [place], [place], [address] Phone: [phone]   Fax:  [phone]  Name: Alexandra Barber MRN: [id number] Date of Birth: 05-16-1932

## 2019-08-30 ENCOUNTER — Other Ambulatory Visit: Payer: Self-pay

## 2019-08-30 ENCOUNTER — Ambulatory Visit: Payer: Medicare Other

## 2019-08-30 DIAGNOSIS — M25511 Pain in right shoulder: Secondary | ICD-10-CM

## 2019-08-30 DIAGNOSIS — M6281 Muscle weakness (generalized): Secondary | ICD-10-CM

## 2019-08-30 DIAGNOSIS — G8929 Other chronic pain: Secondary | ICD-10-CM

## 2019-08-30 NOTE — Therapy (Signed)
[facility] Outpatient Rehabilitation [facility] [address] [place], [place], [address] Phone: [phone]   Fax:  [phone]  Physical Therapy Treatment  Patient Details  Name: Alexandra Barber MRN: [id number] Date of Birth: 1931/10/15 Referring Provider (PT): Gershon Mussel  , MD   Encounter Date: 08/30/2019  PT End of Session - 08/30/19 1213    Visit Number  5    Number of Visits  16    Date for PT Re-Evaluation  10/07/19    Authorization Type  BCBS MCR    PT Start Time  1130    PT Stop Time  1214    PT Time Calculation (min)  44 min    Activity Tolerance  Patient tolerated treatment well    Behavior During Therapy  [facility] for tasks assessed/performed       Past Medical History:  Diagnosis Date  . Asthma     Past Surgical History:  Procedure Laterality Date  . ABDOMINAL HYSTERECTOMY    . BREAST EXCISIONAL BIOPSY Right   . KNEE SURGERY    . ovary removed Bilateral    when age 24  . ROTATOR CUFF REPAIR Left     There were no vitals filed for this visit.  Subjective Assessment - 08/30/19 1141    Subjective  Noo pain . Mild soreness with pullys. One incident of pain when doing hair could not lower arm form overhead. normal activity  without pain    Currently in Pain?  No/denies                       [facility] Adult PT Treatment/Exercise - 08/30/19 0001      Shoulder Exercises: Standing   Flexion  Both;10 reps    Shoulder Flexion Weight (lbs)  1    Flexion Limitations  second set with 2#    ABduction  Strengthening;Both;10 reps    Shoulder ABduction Weight (lbs)  1    ABduction Limitations  second swith 2#et     Extension  Both;10 reps    Theraband Level (Shoulder Extension)  Level 2 (Red)    Extension Weight (lbs)  1    Extension Limitations  second set with 2#    Row  Both;20 reps    Theraband Level (Shoulder Row)  Level 2 (Red)    Other Standing Exercises  Freemotion punches with 3 # x 15 RT and press downs RT arm  7 # x 15,   10  # kettle bell lift from 8# platform x 15 RT arm with knees assist    Other Standing Exercises  bicep curls 2 x 10 5#, bent row 5# x 15   triceps in bent row position      Shoulder Exercises: Pulleys   Flexion  2 minutes    Flexion Limitations  seated    Scaption  1 minute    Scaption Limitations  seated      Shoulder Exercises: ROM/Strengthening   UBE (Upper Arm Bike)  L1 4 min forward               PT Short Term Goals - 08/25/19 1303      PT SHORT TERM GOAL #1   Title  She will be indpendent with initial hEP    Status  Achieved        PT Long Term Goals - 08/30/19 1212      PT LONG TERM GOAL #1   Title  She will be able to  do all HEP issued    Status  On-going      PT LONG TERM GOAL #2   Title  She will be able to perform normal self care with 1-2 max pain    Status  Achieved      PT LONG TERM GOAL #3   Title  she will be able to lift normal items at home into upper cabinets with 1-2 max pain    Status  On-going      PT LONG TERM GOAL #4   Title  She will be able to dress putting on coat/shirt without increased effort or pain.    Status  Achieved      PT LONG TERM GOAL #5   Title  FOTO score with decr to 29% or less    Status  Unable to assess            Plan - 08/30/19 1210    Clinical Impression Statement  She is tolerating increased load without increased pain. Functional activity at home is improved .   generall no pain.   would benefit from continjed strengthening    PT Treatment/Interventions  Iontophoresis 4mg /ml Dexamethasone;Moist Heat;Manual techniques;Therapeutic exercise;Therapeutic activities;Patient/family education;Taping;Passive range of motion    PT Next Visit Plan  Review HEP and expand as able .  Modalities as needed  .  Gentle stretching  measure ROM and Goals  AROM with 1-3# as tolerated    PT Home Exercise Plan  Activ flexion  and abduction in painfree ROm , shoulder shrugs: isometrics and supine cane , Sidelying ER   standing red  band row/extension/IR /ER,  scapula retraction with depression with and without bands    Consulted and Agree with Plan of Care  Patient       Patient will benefit from skilled therapeutic intervention in order to improve the following deficits and impairments:  Pain, Decreased strength, Decreased activity tolerance, Impaired UE functional use  Visit Diagnosis: Chronic right shoulder pain  Muscle weakness (generalized)     Problem List Patient Active Problem List   Diagnosis Date Noted  . Hyperlipidemia 09/09/2016  . Hypertension 09/09/2016  . Osteoarthritis 09/09/2016  . Osteoporosis 09/09/2016  . History of iron deficiency anemia 09/09/2016    Darrel Hoover PT 08/30/2019, 12:15 PM  [facility] [address] [place], [place], [address] Phone: [phone]   Fax:  [phone]  Name: Alexandra Barber MRN: [id number] Date of Birth: 1932/04/16

## 2019-09-01 ENCOUNTER — Ambulatory Visit: Payer: Medicare Other | Admitting: Physical Therapy

## 2019-09-06 ENCOUNTER — Other Ambulatory Visit: Payer: Self-pay

## 2019-09-06 ENCOUNTER — Ambulatory Visit: Payer: Medicare Other | Attending: Physician Assistant | Admitting: Physical Therapy

## 2019-09-06 ENCOUNTER — Encounter: Payer: Self-pay | Admitting: Physical Therapy

## 2019-09-06 DIAGNOSIS — M6281 Muscle weakness (generalized): Secondary | ICD-10-CM | POA: Insufficient documentation

## 2019-09-06 DIAGNOSIS — M25511 Pain in right shoulder: Secondary | ICD-10-CM | POA: Insufficient documentation

## 2019-09-06 DIAGNOSIS — G8929 Other chronic pain: Secondary | ICD-10-CM | POA: Insufficient documentation

## 2019-09-06 NOTE — Therapy (Signed)
[facility] Outpatient Rehabilitation [facility] [address] [place], [place], [address] Phone: [phone]   Fax:  [phone]  Physical Therapy Treatment  Patient Details  Name: Alexandra Barber MRN: [id number] Date of Birth: 20-Nov-1931 Referring Provider (PT): Gershon Mussel  , MD   Encounter Date: 09/06/2019  PT End of Session - 09/06/19 1107    Visit Number  6    Number of Visits  16    Date for PT Re-Evaluation  10/07/19    Authorization Type  BCBS MCR    PT Start Time  1100    PT Stop Time  1145    PT Time Calculation (min)  45 min       Past Medical History:  Diagnosis Date  . Asthma     Past Surgical History:  Procedure Laterality Date  . ABDOMINAL HYSTERECTOMY    . BREAST EXCISIONAL BIOPSY Right   . KNEE SURGERY    . ovary removed Bilateral    when age 56  . ROTATOR CUFF REPAIR Left     There were no vitals filed for this visit.  Subjective Assessment - 09/06/19 1105    Subjective  I can do my housework with no pain. Sometimes a certain movement will cause pain. I can comb hair now without difficulty. It does not hurt but I can feel the soreness in the shoulder; I know there is something wrong with it.    Currently in Pain?  No/denies   just soreness                      [facility] Adult PT Treatment/Exercise - 09/06/19 0001      Shoulder Exercises: Standing   Horizontal ABduction  10 reps   2 sets   Theraband Level (Shoulder Horizontal ABduction)  Level 2 (Red)    Internal Rotation  20 reps    Theraband Level (Shoulder Internal Rotation)  Level 2 (Red)    Flexion  10 reps   2 sets   Shoulder Flexion Weight (lbs)  2    ABduction  10 reps   2sets   Shoulder ABduction Weight (lbs)  2    Extension  20 reps    Theraband Level (Shoulder Extension)  Level 2 (Red)    Row  Both;20 reps    Theraband Level (Shoulder Row)  Level 2 (Red)    Other Standing Exercises  Lat pull 10# bilateral x 20 , 10# Rt UE KB lift from 8inch step  using knees     Other Standing Exercises  isometric abduction with straigh and bentelbow 5 reps each using towel at wall; bicep curls 2 x 10 5#, bent row 5# x 15   triceps in bent row position      Shoulder Exercises: Pulleys   Flexion  2 minutes    Scaption  2 minutes      Shoulder Exercises: ROM/Strengthening   UBE (Upper Arm Bike)  L2 4 min forward    Other ROM/Strengthening Exercises  UE ranger for abduction standing #14 level on wall, then shoulder flexion with horizontals, felt some pain with end ranges               PT Short Term Goals - 08/25/19 1303      PT SHORT TERM GOAL #1   Title  She will be indpendent with initial hEP    Status  Achieved        PT Long Term Goals - 08/30/19 1212  PT LONG TERM GOAL #1   Title  She will be able to do all HEP issued    Status  On-going      PT LONG TERM GOAL #2   Title  She will be able to perform normal self care with 1-2 max pain    Status  Achieved      PT LONG TERM GOAL #3   Title  she will be able to lift normal items at home into upper cabinets with 1-2 max pain    Status  On-going      PT LONG TERM GOAL #4   Title  She will be able to dress putting on coat/shirt without increased effort or pain.    Status  Achieved      PT LONG TERM GOAL #5   Title  FOTO score with decr to 29% or less    Status  Unable to assess            Plan - 09/06/19 1150    Clinical Impression Statement  Pain with overhead ER motions improved however abduction from side still painful. Began abduction isometrics with cues not to increase pain.UE ranger and pulleys used for AA abduction without pain. Continued with scapular and shoulder strengthening as tolerated.    PT Next Visit Plan  Review HEP and expand as able .  Modalities as needed  .  Gentle stretching  measure ROM and Goals  AROM with 1-3# as tolerated    PT Home Exercise Plan  Activ flexion  and abduction in painfree ROm , shoulder shrugs: isometrics and supine cane ,  Sidelying ER   standing red band row/extension/IR /ER,  scapula retraction with depression with and without bands       Patient will benefit from skilled therapeutic intervention in order to improve the following deficits and impairments:  Pain, Decreased strength, Decreased activity tolerance, Impaired UE functional use  Visit Diagnosis: Chronic right shoulder pain  Muscle weakness (generalized)     Problem List Patient Active Problem List   Diagnosis Date Noted  . Hyperlipidemia 09/09/2016  . Hypertension 09/09/2016  . Osteoarthritis 09/09/2016  . Osteoporosis 09/09/2016  . History of iron deficiency anemia 09/09/2016    Dorene Ar, PTA 09/06/2019, 12:22 PM  [facility] [address] [place], [place], [address] Phone: [phone]   Fax:  [phone]  Name: Nashla Althoff MRN: [id number] Date of Birth: 12/14/1931

## 2019-09-08 ENCOUNTER — Ambulatory Visit: Payer: Medicare Other

## 2019-09-08 ENCOUNTER — Other Ambulatory Visit: Payer: Self-pay

## 2019-09-08 DIAGNOSIS — M25511 Pain in right shoulder: Secondary | ICD-10-CM | POA: Diagnosis not present

## 2019-09-08 DIAGNOSIS — M6281 Muscle weakness (generalized): Secondary | ICD-10-CM | POA: Diagnosis not present

## 2019-09-08 DIAGNOSIS — G8929 Other chronic pain: Secondary | ICD-10-CM

## 2019-09-08 NOTE — Therapy (Signed)
[place], [place], [address] Phone: [phone]   Fax:  [phone]  Physical Therapy Treatment/Discharge  Patient Details  Name: Alexandra Barber MRN: [id number] Date of Birth: November 25, 1931 Referring Provider (PT): Frankey Shown  , MD   Encounter Date: 09/08/2019  PT End of Session - 09/08/19 1210    Visit Number  7    Number of Visits  16    Date for PT Re-Evaluation  10/07/19    Authorization Type  BCBS MCR    PT Start Time  1150   Not checked in   PT Stop Time  1215    PT Time Calculation (Barber)  25 Barber    Activity Tolerance  Patient tolerated treatment well    Behavior During Therapy  [facility] for tasks assessed/performed       Past Medical History:  Diagnosis Date  . Asthma     Past Surgical History:  Procedure Laterality Date  . ABDOMINAL HYSTERECTOMY    . BREAST EXCISIONAL BIOPSY Right   . KNEE SURGERY    . ovary removed Bilateral    when age 76  . ROTATOR CUFF REPAIR Left     There were no vitals filed for this visit.  Subjective Assessment - 09/08/19 1152    Subjective  Want to do my HEP for awhile and see how i do.    Pain Score  2     Pain Location  Shoulder    Pain Orientation  Right    Pain Descriptors / Indicators  Sore    Pain Type  Chronic pain    Pain Onset  More than a month ago    Pain Frequency  Intermittent    Aggravating Factors   reaching out to side    Pain Relieving Factors  rest         [facility] PT Assessment - 09/08/19 0001      Assessment   Medical Diagnosis  RT shoulder pain    Referring Provider (PT)  Frankey Shown  , MD      AROM   Right Shoulder Flexion  130 Degrees    Right Shoulder ABduction  120 Degrees    Right Shoulder Internal Rotation  45 Degrees    Right Shoulder External Rotation  80 Degrees      Strength   Overall Strength Comments  weakness with abduciton 3/5                    [facility] Adult PT Treatment/Exercise - 09/08/19 0001      Shoulder Exercises: Standing   Horizontal ABduction  10 reps;15 reps    Theraband Level (Shoulder Horizontal ABduction)  Level 4 (Blue)    External Rotation  15 reps    Theraband Level (Shoulder External Rotation)  Level 4 (Blue)    Flexion Limitations  punches x 12 with blue band    ABduction  Both    Extension  15 reps    Theraband Level (Shoulder Extension)  Level 4 (Blue)    Row  Both;15 reps    Theraband Level (Shoulder Row)  Level 4 (Blue)      Shoulder Exercises: Pulleys   Flexion  2 minutes    Scaption  2 minutes      Shoulder Exercises: Isometric Strengthening   ABduction Limitations  10x 5 sec             PT Education - 09/08/19 1224    Education  Details  HEP    Person(s) Educated  Patient    Methods  Explanation;Demonstration;Tactile cues;Verbal cues;Handout    Comprehension  Returned demonstration;Verbalized understanding       PT Short Term Goals - 08/25/19 1303      PT SHORT TERM GOAL #1   Title  She will be indpendent with initial hEP    Status  Achieved        PT Long Term Goals - 09/08/19 1226      PT LONG TERM GOAL #1   Title  She will be able to do all HEP issued    Status  Achieved      PT LONG TERM GOAL #2   Title  She will be able to perform normal self care with 1-2 max pain    Status  Achieved      PT LONG TERM GOAL #3   Title  she will be able to lift normal items at home into upper cabinets with 1-2 max pain    Status  Partially Met      PT LONG TERM GOAL #4   Title  She will be able to dress putting on coat/shirt without increased effort or pain.    Status  Achieved      PT LONG TERM GOAL #5   Baseline  36% no change    Status  Not Met            Plan - 09/08/19 1224    Clinical Impression Statement  She is pleased with progress and wanted to do her HEP for awhil independently. agreed to discharge    PT Treatment/Interventions  Iontophoresis 28m/ml Dexamethasone;Moist Heat;Manual techniques;Therapeutic  exercise;Therapeutic activities;Patient/family education;Taping;Passive range of motion    PT Next Visit Plan  Discharge with HEP    PT Home Exercise Plan  Activ flexion  and abduction in painfree ROm , shoulder shrugs: isometrics and supine cane , Sidelying ER   standing red band row/extension/IR /ER,  scapula retraction with depression with and without bands isometric abduction ,   ER and isometric abduciton   and hor abduciton   green and blue band issued    Consulted and Agree with Plan of Care  Patient       Patient will benefit from skilled therapeutic intervention in order to improve the following deficits and impairments:  Pain, Decreased strength, Decreased activity tolerance, Impaired UE functional use  Visit Diagnosis: Chronic right shoulder pain  Muscle weakness (generalized)     Problem List Patient Active Problem List   Diagnosis Date Noted  . Hyperlipidemia 09/09/2016  . Hypertension 09/09/2016  . Osteoarthritis 09/09/2016  . Osteoporosis 09/09/2016  . History of iron deficiency anemia 09/09/2016    CDarrel HooverPT 09/08/2019, 1:22 PM  [facility] [address]Phone: [phone]  Fax:  [phone] Name: Alexandra MinMRN: [id number] of Birth: 123-Dec-1933 PHYSICAL THERAPY DISCHARGE SUMMARY  Visits from Start of Care: 7  Current functional level related to goals / functional outcomes: See above   Remaining deficits: See above   Education / Equipment: HEP  Plan: Patient agrees to discharge.  Patient goals were partially met. Patient is being discharged due to being pleased with the current functional level.  ?????

## 2019-09-08 NOTE — Patient Instructions (Signed)
Isometric abduction , ER and hor abduction x 10-20 reps daily 1-2 sets,

## 2019-09-30 ENCOUNTER — Encounter: Payer: Self-pay | Admitting: Family Medicine

## 2019-09-30 ENCOUNTER — Other Ambulatory Visit: Payer: Self-pay

## 2019-09-30 ENCOUNTER — Ambulatory Visit (INDEPENDENT_AMBULATORY_CARE_PROVIDER_SITE_OTHER): Payer: Medicare Other | Admitting: Family Medicine

## 2019-09-30 VITALS — BP 130/76 | HR 68 | Temp 98.2°F | Resp 12 | Ht 61.0 in | Wt 110.0 lb

## 2019-09-30 DIAGNOSIS — M810 Age-related osteoporosis without current pathological fracture: Secondary | ICD-10-CM | POA: Diagnosis not present

## 2019-09-30 DIAGNOSIS — Z0000 Encounter for general adult medical examination without abnormal findings: Secondary | ICD-10-CM

## 2019-09-30 DIAGNOSIS — E782 Mixed hyperlipidemia: Secondary | ICD-10-CM | POA: Diagnosis not present

## 2019-09-30 DIAGNOSIS — I10 Essential (primary) hypertension: Secondary | ICD-10-CM

## 2019-09-30 LAB — LIPID PANEL
Cholesterol: 225 mg/dL — ABNORMAL HIGH (ref <200)
HDL: 71 mg/dL (ref >=50)
LDL Cholesterol (Calc): 140 mg/dL (calc) — ABNORMAL HIGH
Non-HDL Cholesterol (Calc): 154 mg/dL (calc) — ABNORMAL HIGH (ref <130)
Total CHOL/HDL Ratio: 3.2 (calc) (ref <5.0)
Triglycerides: 58 mg/dL (ref <150)

## 2019-09-30 LAB — CBC WITH DIFFERENTIAL/PLATELET
Absolute Monocytes: 662 cells/uL (ref 200–950)
Basophils Absolute: 29 cells/uL (ref 0–200)
Basophils Relative: 0.6 %
Eosinophils Absolute: 191 cells/uL (ref 15–500)
Eosinophils Relative: 3.9 %
HCT: 34.1 % — ABNORMAL LOW (ref 35.0–45.0)
Hemoglobin: 10.9 g/dL — ABNORMAL LOW (ref 11.7–15.5)
Lymphs Abs: 2004 cells/uL (ref 850–3900)
MCH: 26.5 pg — ABNORMAL LOW (ref 27.0–33.0)
MCHC: 32 g/dL (ref 32.0–36.0)
MCV: 82.8 fL (ref 80.0–100.0)
MPV: 11 fL (ref 7.5–12.5)
Monocytes Relative: 13.5 %
Neutro Abs: 2014 cells/uL (ref 1500–7800)
Neutrophils Relative %: 41.1 %
Platelets: 248 10*3/uL (ref 140–400)
RBC: 4.12 10*6/uL (ref 3.80–5.10)
RDW: 13.6 % (ref 11.0–15.0)
Total Lymphocyte: 40.9 %
WBC: 4.9 10*3/uL (ref 3.8–10.8)

## 2019-09-30 LAB — COMPREHENSIVE METABOLIC PANEL
AG Ratio: 1.7 (calc) (ref 1.0–2.5)
ALT: 10 U/L (ref 6–29)
AST: 20 U/L (ref 10–35)
Albumin: 4.5 g/dL (ref 3.6–5.1)
Alkaline phosphatase (APISO): 34 U/L — ABNORMAL LOW (ref 37–153)
BUN: 14 mg/dL (ref 7–25)
CO2: 24 mmol/L (ref 20–32)
Calcium: 10.1 mg/dL (ref 8.6–10.4)
Chloride: 100 mmol/L (ref 98–110)
Creat: 0.74 mg/dL (ref 0.60–0.88)
Globulin: 2.7 g/dL (calc) (ref 1.9–3.7)
Glucose, Bld: 88 mg/dL (ref 65–99)
Potassium: 4.2 mmol/L (ref 3.5–5.3)
Sodium: 137 mmol/L (ref 135–146)
Total Bilirubin: 0.7 mg/dL (ref 0.2–1.2)
Total Protein: 7.2 g/dL (ref 6.1–8.1)

## 2019-09-30 NOTE — Progress Notes (Signed)
Subjective:   Patient presents for Medicare Annual/Subsequent preventive examination.   Patient presents for Medicare Annual/Subsequent preventive examination.   She here for wellness exam.  She has no specific concerns.  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  She continues to dance for exercise.  She is taking her vitamins as well as her blood pressure and cholesterol medication.   HTN- taking lisinopril low dose  2.5mg , withot difficulty  Hyperlipidemia- taking Omega 3 and pravastatin   She has completed PT for her shoulder, pain has improved, she is able to do ADL's housework without any pain on occ    Review Past Medical/Family/Social: Per EMR   Risk Factors  Current exercise habits: Regular exercise and dance Dietary issues discussed: No concerns  Cardiac risk factors: Hypertension, hyperlipidemia  Depression Screen  (Note: if answer to either of the following is "Yes", a more complete depression screening is indicated)  Over the past two weeks, have you felt down, depressed or hopeless? No Over the past two weeks, have you felt little interest or pleasure in doing things? No Have you lost interest or pleasure in daily life? No Do you often feel hopeless? No Do you cry easily over simple problems? No   Activities of Daily Living  In your present state of health, do you have any difficulty performing the following activities?:  Driving? No  Managing money? No  Feeding yourself? No  Getting from bed to chair? No  Climbing a flight of stairs? No  Preparing food and eating?: No  Bathing or showering? No  Getting dressed: No  Getting to the toilet? No  Using the toilet:No  Moving around from place to place: No  In the past year have you fallen or had a near fall?:No  Are you sexually active? No  Do you have more than one partner? No   Hearing Difficulties: No  Do you often ask people to speak up or  repeat themselves? No  Do you experience ringing or noises in your ears? No Do you have difficulty understanding soft or whispered voices? No  Do you feel that you have a problem with memory? No Do you often misplace items? No  Do you feel safe at home? Yes  Cognitive Testing  Alert? Yes Normal Appearance?Yes  Oriented to person? Yes Place? Yes  Time? Yes  Recall of three objects? Yes  Can perform simple calculations? Yes  Displays appropriate judgment?Yes  Can read the correct time from a watch face?Yes   List the Names of Other Physician/Practitioners you currently use:   Dr. Enid Baas- Eye doctor Dr. Erlinda Hong  Screening Tests / Date ColonoscopyOver age Zostavax- Declines due to cost MammogramUTD Influenza VaccineUTD Pneumonia- UTD Tetanus/tdapDeclines  COVID-19 vaccine done  ROS: GEN- denies fatigue, fever, weight loss,weakness, recent illness HEENT- denies eye drainage, change in vision, nasal discharge, CVS- denies chest pain, palpitations RESP- denies SOB, cough, wheeze ABD- denies N/V, change in stools, abd pain GU- denies dysuria, hematuria, dribbling, incontinence MSK- denies joint pain, muscle aches, injury Neuro- denies headache, dizziness, syncope, seizure activity  Physical: GEN- NAD, alert and oriented x3 HEENT- PERRL, EOMI, non injected sclera, pink conjunctiva, MMM, oropharynx clear, Neck- Supple, no thryomegaly, no bruit CVS- RRR, no murmur RESP-CTAB ABD-NABS,soft,NT,ND EXT- No edema Pulses- Radial, DP- 2+   Assessment:    Annual wellness medicare exam   Plan:    During the course of the visit the patient was  educated and counseled about appropriate screening and preventive services including:  Prevention UTD for her age No recent falls, CAGE neg/depression screen neg HTN controlled Hyperlipidemia continue statin drug, omega 3, very active continues to benefit from statin, check labs Osteoporosis- calcium and D,  weight bearing exercise Pt has living will, Full CODE     Diet review for nutrition referral? Yes ____ Not Indicated __x__  Patient Instructions (the written plan) was given to the patient.  Medicare Attestation  I have personally reviewed:  The patient's medical and social history  Their use of alcohol, tobacco or illicit drugs  Their current medications and supplements  The patient's functional ability including ADLs,fall risks, home safety risks, cognitive, and hearing and visual impairment  Diet and physical activities  Evidence for depression or mood disorders  The patient's weight, height, BMI, and visual acuity have been recorded in the chart. I have made referrals, counseling, and provided education to the patient based on review of the above and I have provided the patient with a written personalized care plan for preventive services.

## 2019-09-30 NOTE — Patient Instructions (Signed)
F/U 6 months

## 2019-10-02 ENCOUNTER — Encounter: Payer: Self-pay | Admitting: Family Medicine

## 2020-01-18 ENCOUNTER — Telehealth: Payer: Self-pay | Admitting: Family Medicine

## 2020-01-18 NOTE — Telephone Encounter (Signed)
Cb# [phone] Alexandra Barber from [place] call on behalf [place]. Per Alexandra Barber pt was prescribe Lisinopril take 2 a day need a new prescription(mail order)  written out for 1 tablet a day

## 2020-01-19 MED ORDER — LISINOPRIL 2.5 MG PO TABS: 2.5000 mg | ORAL_TABLET | Freq: Every day | ORAL | 3 refills | Status: DC

## 2020-01-19 NOTE — Telephone Encounter (Signed)
Correct dose sent to pharmacy.  

## 2020-03-30 ENCOUNTER — Other Ambulatory Visit: Payer: Self-pay

## 2020-03-30 ENCOUNTER — Encounter: Payer: Self-pay | Admitting: Family Medicine

## 2020-03-30 ENCOUNTER — Ambulatory Visit (INDEPENDENT_AMBULATORY_CARE_PROVIDER_SITE_OTHER): Payer: Medicare Other | Admitting: Family Medicine

## 2020-03-30 VITALS — BP 138/78 | HR 78 | Temp 97.8°F | Resp 16 | Ht 61.0 in | Wt 103.0 lb

## 2020-03-30 DIAGNOSIS — M810 Age-related osteoporosis without current pathological fracture: Secondary | ICD-10-CM | POA: Diagnosis not present

## 2020-03-30 DIAGNOSIS — E782 Mixed hyperlipidemia: Secondary | ICD-10-CM

## 2020-03-30 DIAGNOSIS — I10 Essential (primary) hypertension: Secondary | ICD-10-CM

## 2020-03-30 DIAGNOSIS — M159 Polyosteoarthritis, unspecified: Secondary | ICD-10-CM

## 2020-03-30 DIAGNOSIS — M8949 Other hypertrophic osteoarthropathy, multiple sites: Secondary | ICD-10-CM

## 2020-03-30 LAB — COMPREHENSIVE METABOLIC PANEL
AG Ratio: 1.8 (calc) (ref 1.0–2.5)
ALT: 9 U/L (ref 6–29)
AST: 19 U/L (ref 10–35)
Albumin: 4.5 g/dL (ref 3.6–5.1)
Alkaline phosphatase (APISO): 34 U/L — ABNORMAL LOW (ref 37–153)
BUN: 15 mg/dL (ref 7–25)
CO2: 23 mmol/L (ref 20–32)
Calcium: 9.6 mg/dL (ref 8.6–10.4)
Chloride: 103 mmol/L (ref 98–110)
Creat: 0.81 mg/dL (ref 0.60–0.88)
Globulin: 2.5 g/dL (calc) (ref 1.9–3.7)
Glucose, Bld: 77 mg/dL (ref 65–99)
Potassium: 4.1 mmol/L (ref 3.5–5.3)
Sodium: 137 mmol/L (ref 135–146)
Total Bilirubin: 0.7 mg/dL (ref 0.2–1.2)
Total Protein: 7 g/dL (ref 6.1–8.1)

## 2020-03-30 LAB — CBC WITH DIFFERENTIAL/PLATELET
Absolute Monocytes: 350 cells/uL (ref 200–950)
Basophils Absolute: 30 cells/uL (ref 0–200)
Basophils Relative: 0.9 %
Eosinophils Absolute: 40 cells/uL (ref 15–500)
Eosinophils Relative: 1.2 %
HCT: 32.2 % — ABNORMAL LOW (ref 35.0–45.0)
Hemoglobin: 10.3 g/dL — ABNORMAL LOW (ref 11.7–15.5)
Lymphs Abs: 1340 cells/uL (ref 850–3900)
MCH: 27.2 pg (ref 27.0–33.0)
MCHC: 32 g/dL (ref 32.0–36.0)
MCV: 85.2 fL (ref 80.0–100.0)
MPV: 10.9 fL (ref 7.5–12.5)
Monocytes Relative: 10.6 %
Neutro Abs: 1541 cells/uL (ref 1500–7800)
Neutrophils Relative %: 46.7 %
Platelets: 205 10*3/uL (ref 140–400)
RBC: 3.78 10*6/uL — ABNORMAL LOW (ref 3.80–5.10)
RDW: 13.4 % (ref 11.0–15.0)
Total Lymphocyte: 40.6 %
WBC: 3.3 10*3/uL — ABNORMAL LOW (ref 3.8–10.8)

## 2020-03-30 LAB — LIPID PANEL
Cholesterol: 216 mg/dL — ABNORMAL HIGH (ref <200)
HDL: 80 mg/dL (ref >=50)
LDL Cholesterol (Calc): 122 mg/dL (calc) — ABNORMAL HIGH
Non-HDL Cholesterol (Calc): 136 mg/dL (calc) — ABNORMAL HIGH (ref <130)
Total CHOL/HDL Ratio: 2.7 (calc) (ref <5.0)
Triglycerides: 45 mg/dL (ref <150)

## 2020-03-30 MED ORDER — PRAVASTATIN SODIUM 20 MG PO TABS: 20.0000 mg | ORAL_TABLET | Freq: Every day | ORAL | 3 refills | Status: DC

## 2020-03-30 MED ORDER — OMEGA 3 500 500 MG PO CAPS: ORAL_CAPSULE | ORAL | Status: DC

## 2020-03-30 MED ORDER — LISINOPRIL 2.5 MG PO TABS: 2.5000 mg | ORAL_TABLET | Freq: Every day | ORAL | 3 refills | Status: DC

## 2020-03-30 NOTE — Assessment & Plan Note (Signed)
Vitamin D 1000-2000IU daily Start Centrum Silver  Omega 3

## 2020-03-30 NOTE — Patient Instructions (Signed)
Centrum silver vitamins over 50  Take vitamin D 1000IU  ( Natures Made, or Natures Bounty, or Spring Value)  Omega 3 - take 2 capsules of the 500mg   F/U 6 months

## 2020-03-30 NOTE — Progress Notes (Signed)
   Subjective:    Patient ID: Alexandra Barber, female    DOB: March 30, 1932, 86 y.o.   MRN: [id number]  Patient presents for Follow-up (is fasting) and Medication Management (discuss vitamins)   Pt here to f/u chronic medical problems   Medications and history reviewed   Uses tums for indigestion rare use   She is  not sure which vitamisn to take her previous brand are no longer available in the store. Feels well.  She is back to dancing.  Still taking her low-dose blood pressure medicine as prescribed without any side effects.    Review Of Systems:  GEN- denies fatigue, fever, weight loss,weakness, recent illness HEENT- denies eye drainage, change in vision, nasal discharge, CVS- denies chest pain, palpitations RESP- denies SOB, cough, wheeze ABD- denies N/V, change in stools, abd pain GU- denies dysuria, hematuria, dribbling, incontinence MSK- denies joint pain, muscle aches, injury Neuro- denies headache, dizziness, syncope, seizure activity       Objective:    BP 138/78 (BP Location: Left Arm, Cuff Size: Normal)   Pulse 78   Temp 97.8 F (36.6 C) (Temporal)   Resp 16   Ht 5\' 1"  (1.549 m)   Wt 103 lb (46.7 kg)   SpO2 98%   BMI 19.46 kg/m  GEN- NAD, alert and oriented x3 HEENT- PERRL, EOMI, non injected sclera, pink conjunctiva, MMM, oropharynx clear Neck- Supple, no thyromegaly CVS- RRR, no murmur RESP-CTAB ABD-NABS,soft,NT,ND EXT- No edema Pulses- Radial, DP- 2+        Assessment & Plan:      Problem List Items Addressed This Visit      Unprioritized   Hyperlipidemia    Taking pravastatin       Relevant Medications   pravastatin (PRAVACHOL) 20 MG tablet   lisinopril (ZESTRIL) 2.5 MG tablet   Other Relevant Orders   Lipid panel   Hypertension - Primary    Blood pressure controlled No SE with meds Pt checks bp at home      Relevant Medications   pravastatin (PRAVACHOL) 20 MG tablet   lisinopril (ZESTRIL) 2.5 MG tablet   Other Relevant  Orders   CBC with Differential/Platelet   Comprehensive metabolic panel   Osteoarthritis    Tylenol as needed       Osteoporosis    Vitamin D 1000-2000IU daily Start Centrum Silver  Omega 3          Note: This dictation was prepared with Dragon dictation along with smaller . Any transcriptional errors that result from this process are unintentional.

## 2020-03-30 NOTE — Assessment & Plan Note (Signed)
Taking pravastatin

## 2020-03-30 NOTE — Assessment & Plan Note (Signed)
Blood pressure controlled No SE with meds Pt checks bp at home

## 2020-03-30 NOTE — Assessment & Plan Note (Signed)
Tylenol as needed.

## 2020-04-04 ENCOUNTER — Encounter: Payer: Self-pay | Admitting: *Deleted

## 2020-05-02 ENCOUNTER — Ambulatory Visit (INDEPENDENT_AMBULATORY_CARE_PROVIDER_SITE_OTHER): Payer: Medicare Other

## 2020-05-02 ENCOUNTER — Other Ambulatory Visit: Payer: Self-pay

## 2020-05-02 DIAGNOSIS — Z23 Encounter for immunization: Secondary | ICD-10-CM

## 2020-05-16 ENCOUNTER — Other Ambulatory Visit: Payer: Self-pay | Admitting: Family Medicine

## 2020-05-16 DIAGNOSIS — Z1231 Encounter for screening mammogram for malignant neoplasm of breast: Secondary | ICD-10-CM

## 2020-07-02 ENCOUNTER — Inpatient Hospital Stay: Admission: RE | Admit: 2020-07-02 | Payer: Medicare Other | Source: Ambulatory Visit

## 2020-07-12 DIAGNOSIS — H5213 Myopia, bilateral: Secondary | ICD-10-CM | POA: Diagnosis not present

## 2020-08-10 ENCOUNTER — Ambulatory Visit
Admission: RE | Admit: 2020-08-10 | Discharge: 2020-08-10 | Disposition: A | Discharge status: Home / Self Care | Payer: Medicare Other | Source: Ambulatory Visit | Attending: Family Medicine | Admitting: Family Medicine

## 2020-08-10 ENCOUNTER — Other Ambulatory Visit: Payer: Self-pay

## 2020-08-10 DIAGNOSIS — Z1231 Encounter for screening mammogram for malignant neoplasm of breast: Secondary | ICD-10-CM | POA: Diagnosis not present

## 2020-08-14 ENCOUNTER — Ambulatory Visit: Payer: Medicare Other

## 2020-09-26 ENCOUNTER — Encounter: Payer: Self-pay | Admitting: Family Medicine

## 2020-09-26 ENCOUNTER — Other Ambulatory Visit: Payer: Self-pay

## 2020-09-26 ENCOUNTER — Ambulatory Visit (INDEPENDENT_AMBULATORY_CARE_PROVIDER_SITE_OTHER): Payer: Medicare Other | Admitting: Family Medicine

## 2020-09-26 VITALS — BP 124/66 | HR 67 | Temp 98.1°F | Ht 61.0 in | Wt 101.0 lb

## 2020-09-26 DIAGNOSIS — M810 Age-related osteoporosis without current pathological fracture: Secondary | ICD-10-CM

## 2020-09-26 DIAGNOSIS — I10 Essential (primary) hypertension: Secondary | ICD-10-CM

## 2020-09-26 DIAGNOSIS — M8949 Other hypertrophic osteoarthropathy, multiple sites: Secondary | ICD-10-CM | POA: Diagnosis not present

## 2020-09-26 DIAGNOSIS — E441 Mild protein-calorie malnutrition: Secondary | ICD-10-CM

## 2020-09-26 DIAGNOSIS — E782 Mixed hyperlipidemia: Secondary | ICD-10-CM

## 2020-09-26 DIAGNOSIS — M159 Polyosteoarthritis, unspecified: Secondary | ICD-10-CM

## 2020-09-26 LAB — CBC WITH DIFFERENTIAL/PLATELET
Absolute Monocytes: 522 cells/uL (ref 200–950)
Basophils Absolute: 50 cells/uL (ref 0–200)
Basophils Relative: 1.1 %
Eosinophils Absolute: 72 cells/uL (ref 15–500)
Eosinophils Relative: 1.6 %
HCT: 34.5 % — ABNORMAL LOW (ref 35.0–45.0)
Hemoglobin: 11.1 g/dL — ABNORMAL LOW (ref 11.7–15.5)
Lymphs Abs: 2124 cells/uL (ref 850–3900)
MCH: 26.8 pg — ABNORMAL LOW (ref 27.0–33.0)
MCHC: 32.2 g/dL (ref 32.0–36.0)
MCV: 83.3 fL (ref 80.0–100.0)
MPV: 11.4 fL (ref 7.5–12.5)
Monocytes Relative: 11.6 %
Neutro Abs: 1733 cells/uL (ref 1500–7800)
Neutrophils Relative %: 38.5 %
Platelets: 237 10*3/uL (ref 140–400)
RBC: 4.14 10*6/uL (ref 3.80–5.10)
RDW: 13.4 % (ref 11.0–15.0)
Total Lymphocyte: 47.2 %
WBC: 4.5 10*3/uL (ref 3.8–10.8)

## 2020-09-26 LAB — LIPID PANEL
Cholesterol: 227 mg/dL — ABNORMAL HIGH (ref <200)
HDL: 82 mg/dL (ref >=50)
LDL Cholesterol (Calc): 129 mg/dL (calc) — ABNORMAL HIGH
Non-HDL Cholesterol (Calc): 145 mg/dL (calc) — ABNORMAL HIGH (ref <130)
Total CHOL/HDL Ratio: 2.8 (calc) (ref <5.0)
Triglycerides: 69 mg/dL (ref <150)

## 2020-09-26 LAB — COMPREHENSIVE METABOLIC PANEL
AG Ratio: 1.6 (calc) (ref 1.0–2.5)
ALT: 9 U/L (ref 6–29)
AST: 20 U/L (ref 10–35)
Albumin: 4.5 g/dL (ref 3.6–5.1)
Alkaline phosphatase (APISO): 37 U/L (ref 37–153)
BUN: 15 mg/dL (ref 7–25)
CO2: 24 mmol/L (ref 20–32)
Calcium: 10.1 mg/dL (ref 8.6–10.4)
Chloride: 103 mmol/L (ref 98–110)
Creat: 0.72 mg/dL (ref 0.60–0.88)
Globulin: 2.9 g/dL (calc) (ref 1.9–3.7)
Glucose, Bld: 87 mg/dL (ref 65–99)
Potassium: 4.4 mmol/L (ref 3.5–5.3)
Sodium: 138 mmol/L (ref 135–146)
Total Bilirubin: 0.6 mg/dL (ref 0.2–1.2)
Total Protein: 7.4 g/dL (ref 6.1–8.1)

## 2020-09-26 MED ORDER — LISINOPRIL 2.5 MG PO TABS
2.5000 mg | ORAL_TABLET | Freq: Every day | ORAL | 3 refills | Status: DC
Start: 2020-09-26 — End: 2021-12-04

## 2020-09-26 MED ORDER — PRAVASTATIN SODIUM 20 MG PO TABS
20.0000 mg | ORAL_TABLET | Freq: Every day | ORAL | 3 refills | Status: DC
Start: 2020-09-26 — End: 2021-12-04

## 2020-09-26 NOTE — Patient Instructions (Signed)
We will call with lab results   

## 2020-09-26 NOTE — Progress Notes (Signed)
   Subjective:    Patient ID: Alexandra Barber, female    DOB: June 20, 1932, 87 y.o.   MRN: [id number]  Patient presents for Hypertension and Hyperlipidemia Patient here follow-up chronic medical problems.  Medications reviewed. Doing well at home.  Continues to be active in her church.  Hypertension she is taking lisinopril 2.5 mg once a day no side effects with the medication.  Due for renal check.  Hyperlipidemia he is on pravastatin 20 mg once a day and omega-3 which also helps with her joints.  Osteoporosis she takes calcium and vitamin D  She has lost 3 more pounds sinc August, she is very strick with her diet, she ha not had ny sweets, and reduced carbs/baked goods  She does not want to be on higher doses of medications  Occ use of naprosyn   She had Eye exam in the Fall, no change to lens, she has small cataract but they are watching   Review Of Systems:  GEN- denies fatigue, fever, weight loss,weakness, recent illness HEENT- denies eye drainage, change in vision, nasal discharge, CVS- denies chest pain, palpitations RESP- denies SOB, cough, wheeze ABD- denies N/V, change in stools, abd pain GU- denies dysuria, hematuria, dribbling, incontinence MSK- denies joint pain, muscle aches, injury Neuro- denies headache, dizziness, syncope, seizure activity       Objective:    BP 124/66   Pulse 67   Temp 98.1 F (36.7 C)   Ht 5\' 1"  (1.549 m)   Wt 101 lb (45.8 kg)   SpO2 98%   BMI 19.08 kg/m  GEN- NAD, alert and oriented x3 HEENT- PERRL, EOMI, non injected sclera, pink conjunctiva, Neck- Supple, no thyromegaly CVS- RRR, no murmur RESP-CTAB ABD-NABS,soft,NT,ND EXT- No edema Pulses- Radial, DP- 2+        Assessment & Plan:      Problem List Items Addressed This Visit      Unprioritized   Hyperlipidemia    Continue pravastatin.  Check liver function today.      Relevant Medications   lisinopril (ZESTRIL) 2.5 MG tablet   pravastatin (PRAVACHOL) 20 MG  tablet   Other Relevant Orders   Lipid panel   Hypertension    Blood pressure controlled no change in medication.      Relevant Medications   lisinopril (ZESTRIL) 2.5 MG tablet   pravastatin (PRAVACHOL) 20 MG tablet   Other Relevant Orders   CBC with Differential/Platelet   Comprehensive metabolic panel   Osteoarthritis    Very rare use of anti-inflammatory or Tylenol.  She does take aspirin daily.  We discussed that she does not need to lose weight.  She is to maintain her weight so she does not lose more muscle mass.  She been very strict with her diet not wanting her cholesterol to go up.      Osteoporosis - Primary   Relevant Medications   cholecalciferol (VITAMIN D3) 25 MCG (1000 UNIT) tablet    Other Visit Diagnoses    Mild protein-calorie malnutrition (HCC)       discussed nutrtion for her age, does not need weight loss or strict diet, reducing fats/ carbs so low      Note: This dictation was prepared with Dragon dictation along with smaller phrase technology. Any transcriptional errors that result from this process are unintentional.

## 2020-09-26 NOTE — Assessment & Plan Note (Signed)
Continue pravastatin.  Check liver function today.

## 2020-09-26 NOTE — Assessment & Plan Note (Signed)
Blood pressure controlled no change in medication. 

## 2020-09-26 NOTE — Assessment & Plan Note (Signed)
Very rare use of anti-inflammatory or Tylenol.  She does take aspirin daily.  We discussed that she does not need to lose weight.  She is to maintain her weight so she does not lose more muscle mass.  She been very strict with her diet not wanting her cholesterol to go up.

## 2020-10-04 ENCOUNTER — Encounter: Payer: Self-pay | Admitting: *Deleted

## 2020-10-22 ENCOUNTER — Ambulatory Visit (INDEPENDENT_AMBULATORY_CARE_PROVIDER_SITE_OTHER): Payer: Medicare Other | Admitting: Medical-Surgical

## 2020-10-22 ENCOUNTER — Encounter: Payer: Self-pay | Admitting: Medical-Surgical

## 2020-10-22 ENCOUNTER — Other Ambulatory Visit: Payer: Self-pay

## 2020-10-22 VITALS — BP 144/75 | HR 73 | Ht 62.0 in | Wt 103.0 lb

## 2020-10-22 DIAGNOSIS — E782 Mixed hyperlipidemia: Secondary | ICD-10-CM | POA: Diagnosis not present

## 2020-10-22 DIAGNOSIS — I10 Essential (primary) hypertension: Secondary | ICD-10-CM | POA: Diagnosis not present

## 2020-10-22 DIAGNOSIS — Z7689 Persons encountering health services in other specified circumstances: Secondary | ICD-10-CM | POA: Diagnosis not present

## 2020-10-22 NOTE — Progress Notes (Signed)
New Patient Office Visit  Subjective:  Patient ID: Alexandra Barber, female    DOB: 1932/05/05  Age: 87 y.o. MRN: [id number]  CC: No chief complaint on file.   HPI Alexandra Barber presents to establish care.   Hypertension-taking lisinopril 2.5 mg daily, tolerating well without side effects.  Blood pressure is slightly elevated today but has been within normal limits at her most recent checks.Denies CP, SOB, palpitations, lower extremity edema, dizziness, headaches, or vision changes.  Hyperlipidemia-recently checked lipid panel showing continued elevations in total and LDL cholesterols.  Taking pravastatin 20 mg daily, tolerating well without side effects.  Osteoporosis-taking daily vitamin D and using Tums as needed.  Stays very active and likes to dance.  Past Medical History:  Diagnosis Date  . Asthma     Past Surgical History:  Procedure Laterality Date  . ABDOMINAL HYSTERECTOMY    . BREAST EXCISIONAL BIOPSY Right    in her 20- over 60 years ago  . KNEE SURGERY    . ovary removed Bilateral    when age 56  . ROTATOR CUFF REPAIR Left     Family History  Problem Relation Age of Onset  . Cancer Mother   . Breast cancer Mother     Social History   Socioeconomic History  . Marital status: Married    Spouse name: Not on file  . Number of children: Not on file  . Years of education: Not on file  . Highest education level: Not on file  Occupational History  . Not on file  Tobacco Use  . Smoking status: Former Games developer  . Smokeless tobacco: Never Used  Substance and Sexual Activity  . Alcohol use: No  . Drug use: No  . Sexual activity: Not on file  Other Topics Concern  . Not on file  Social History Narrative  . Not on file   Social Determinants of Health   Financial Resource Strain: Not on file  Food Insecurity: Not on file  Transportation Needs: Not on file  Physical Activity: Not on file  Stress: Not on file  Social Connections: Not on file  Intimate  Partner Violence: Not on file    ROS Review of Systems  Constitutional: Negative for chills, fatigue, fever and unexpected weight change.  HENT: Negative for congestion, rhinorrhea, sinus pressure and sore throat.   Respiratory: Negative for cough, chest tightness and shortness of breath.   Cardiovascular: Negative for chest pain, palpitations and leg swelling.  Gastrointestinal: Negative for abdominal pain, constipation, diarrhea, nausea and vomiting.  Genitourinary: Negative for dysuria, frequency and urgency.  Musculoskeletal: Positive for arthralgias (intermittent).  Skin: Negative for rash and wound.  Neurological: Negative for dizziness, light-headedness and headaches.  Hematological: Does not bruise/bleed easily.  Psychiatric/Behavioral: Negative for dysphoric mood, self-injury, sleep disturbance and suicidal ideas. The patient is not nervous/anxious.     Objective:   Today's Vitals: BP (!) 144/75   Pulse 73   Ht 5\' 2"  (1.575 m)   Wt 103 lb (46.7 kg)   BMI 18.84 kg/m   Physical Exam Vitals reviewed.  Constitutional:      General: She is not in acute distress.    Appearance: Normal appearance.  HENT:     Head: Normocephalic and atraumatic.  Cardiovascular:     Rate and Rhythm: Normal rate and regular rhythm.     Pulses: Normal pulses.     Heart sounds: Normal heart sounds. No murmur heard. No friction rub. No gallop.   Pulmonary:  Effort: Pulmonary effort is normal. No respiratory distress.     Breath sounds: Normal breath sounds. No wheezing.  Skin:    General: Skin is warm and dry.  Neurological:     Mental Status: She is alert and oriented to person, place, and time.  Psychiatric:        Mood and Affect: Mood normal.        Behavior: Behavior normal.        Thought Content: Thought content normal.        Judgment: Judgment normal.     Assessment & Plan:   1. Encounter to establish care Reviewed available information and discussed healthcare  concerns with patient.  Up-to-date on preventative care  2. Primary hypertension Continue lisinopril 2.5 mg daily.  No refills needed at this time.  Recommend low-sodium diet and continued exercise.  3. Mixed hyperlipidemia Continue pravastatin 20 mg daily.  No refills needed at this time.  Recommend low-fat diet.   Outpatient Encounter Medications as of 10/22/2020  Medication Sig  . calcium carbonate (TUMS EX) 750 MG chewable tablet Chew 2 tablets by mouth as needed for heartburn.  . cholecalciferol (VITAMIN D3) 25 MCG (1000 UNIT) tablet Take 1,000 Units by mouth daily.  Marland Kitchen lisinopril (ZESTRIL) 2.5 MG tablet Take 1 tablet (2.5 mg total) by mouth daily.  . Omega-3 Fatty Acids (OMEGA 3 500) 500 MG CAPS Take 2 capsules daily  . pravastatin (PRAVACHOL) 20 MG tablet Take 1 tablet (20 mg total) by mouth daily.   No facility-administered encounter medications on file as of 10/22/2020.    Follow-up: Return in about 6 months (around 04/24/2021) for HTN/HLD follow up or sooner if needed.   Thayer Ohm, DNP, APRN, FNP-BC [facility]

## 2021-01-15 ENCOUNTER — Ambulatory Visit (INDEPENDENT_AMBULATORY_CARE_PROVIDER_SITE_OTHER): Payer: Medicare Other | Admitting: Medical-Surgical

## 2021-01-15 DIAGNOSIS — Z0000 Encounter for general adult medical examination without abnormal findings: Secondary | ICD-10-CM

## 2021-01-15 NOTE — Progress Notes (Signed)
MEDICARE ANNUAL WELLNESS VISIT  01/15/2021  Telephone Visit Disclaimer This Medicare AWV was conducted by telephone due to national recommendations for restrictions regarding the COVID-19 Pandemic (e.g. social distancing).  I verified, using two identifiers, that I am speaking with Alexandra Barber or their authorized healthcare agent. I discussed the limitations, risks, security, and privacy concerns of performing an evaluation and management service by telephone and the potential availability of an in-person appointment in the future. The patient expressed understanding and agreed to proceed.  Location of Patient: Home Location of Provider (nurse):  In the office.  Subjective:    Alexandra Barber is a 87 y.o. female patient of Christen Butter, NP who had a Medicare Annual Wellness Visit today via telephone. [name] is Retired and lives with their spouse. she has 2 children (one has deceased). she reports that she is socially active and does interact with friends/family regularly. 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  Patient Care Team: Christen Butter, NP as PCP - General (Nurse Practitioner)  Advanced Directives 01/15/2021 10/22/2020 09/30/2019 08/15/2019  Does Patient Have a Medical Advance Directive? Yes Yes Yes Yes  Type of Advance Directive Living will Living will - Living will;Healthcare Power of Attorney;Out of facility DNR (pink MOST or yellow form)  Does patient want to make changes to medical advance directive? No - Patient declined No - Patient declined No - Patient declined -    Hospital Utilization Over the Past 12 Months: # of hospitalizations or ER visits: 0 # of surgeries: 0  Review of Systems    Patient reports that her overall health is unchanged compared to last year.  History obtained from chart review and the patient  Patient Reported Readings (BP, Pulse, CBG, Weight, etc) none  Pain Assessment Pain : No/denies  pain     Current Medications & Allergies (verified) Allergies as of 01/15/2021       Reactions   Hctz [hydrochlorothiazide]    Lovastatin    Zetia [ezetimibe]         Medication List        Accurate as of January 15, 2021 10:55 AM. If you have any questions, ask your nurse or doctor.          calcium carbonate 750 MG chewable tablet Commonly known as: TUMS EX Chew 2 tablets by mouth as needed for heartburn.   cholecalciferol 25 MCG (1000 UNIT) tablet Commonly known as: VITAMIN D3 Take 1,000 Units by mouth daily.   lisinopril 2.5 MG tablet Commonly known as: ZESTRIL Take 1 tablet (2.5 mg total) by mouth daily.   Omega 3 500 500 MG Caps Take 2 capsules daily What changed: additional instructions   pravastatin 20 MG tablet Commonly known as: PRAVACHOL Take 1 tablet (20 mg total) by mouth daily.        History (reviewed): Past Medical History:  Diagnosis Date   Asthma    Past Surgical History:  Procedure Laterality Date   ABDOMINAL HYSTERECTOMY     BREAST EXCISIONAL BIOPSY Right    in her 78- over 60 years ago   KNEE SURGERY     ovary removed Bilateral    when age 30   ROTATOR CUFF REPAIR Left    Family History  Problem Relation Age of Onset   Cancer Mother    Breast cancer Mother    Social History   Socioeconomic History   Marital status: Married    Spouse name: Alexandra Barber  Number of children: 1   Years of education: 9   Highest education level: 9th grade  Occupational History    Comment: retired  Tobacco Use   Smoking status: Former    Pack years: 0.00   Smokeless tobacco: Never  Substance and Sexual Activity   Alcohol use: No   Drug use: No   Sexual activity: Not on file  Other Topics Concern   Not on file  Social History Narrative   Lives with her husband. She and her husband, they live with in a senior retirement community center. 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   Social Determinants of Health   Financial Resource  Strain: Low Risk    Difficulty of Paying Living Expenses: Not hard at all  Food Insecurity: No Food Insecurity   Worried About Programme researcher, broadcasting/film/video in the Last Year: Never true   Ran Out of Food in the Last Year: Never true  Transportation Needs: No Transportation Needs   Lack of Transportation (Medical): No   Lack of Transportation (Non-Medical): No  Physical Activity: Insufficiently Active   Days of Exercise per Week: 2 days   Minutes of Exercise per Session: 60 min  Stress: No Stress Concern Present   Feeling of Stress : Not at all  Social Connections: Not on file    Activities of Daily Living No flowsheet data found.  Patient Education/ Literacy How often do you need to have someone help you when you read instructions, pamphlets, or other written materials from your doctor or pharmacy?: 1 - Never What is the last grade level you completed in school?: 9th grade  Exercise    Diet Patient reports consuming 2 meals a day and 2 snack(s) a day Patient reports that her primary diet is: Regular Patient reports that she does have regular access to food.   Depression Screen PHQ 2/9 Scores 01/15/2021 10/22/2020 09/26/2020 09/30/2019 03/30/2019 09/28/2018 09/15/2018  PHQ - 2 Score 0 1 0 0 0 0 1  PHQ- 9 Score - 2 - 0 - - -     Fall Risk Fall Risk  01/15/2021 09/26/2020 09/30/2019 03/30/2019 09/28/2018  Falls in the past year? 1 0 0 0 1  Number falls in past yr: 0 0 - - 1  Injury with Fall? 0 0 - - 0  Risk for fall due to : No Fall Risks - No Fall Risks - -  Follow up Falls evaluation completed Falls evaluation completed Falls evaluation completed Falls evaluation completed Falls evaluation completed     Objective:  Iya Waddell seemed alert and oriented and she participated appropriately during our telephone visit.  Blood Pressure Weight BMI  BP Readings from Last 3 Encounters:  10/22/20 (!) 144/75  09/26/20 124/66  03/30/20 138/78   Wt Readings from Last 3 Encounters:  10/22/20  103 lb (46.7 kg)  09/26/20 101 lb (45.8 kg)  03/30/20 103 lb (46.7 kg)   BMI Readings from Last 1 Encounters:  10/22/20 18.84 kg/m    *Unable to obtain current vital signs, weight, and BMI due to telephone visit type  Hearing/Vision  Perian did not seem to have difficulty with hearing/understanding during the telephone conversation Reports that she has had a formal eye exam by an eye care professional within the past year Reports that she has not had a formal hearing evaluation within the past year *Unable to fully assess hearing and vision during telephone visit type  Cognitive Function: No flowsheet data found. (Normal:0-7,  Significant for Dysfunction: >8)  Normal Cognitive Function Screening: Yes   Immunization & Health Maintenance Record Immunization History  Administered Date(s) Administered   Fluad Quad(high Dose 65+) 03/30/2019, 05/02/2020   Influenza, High Dose Seasonal PF 07/08/2013, 05/13/2014, 04/23/2015, 05/05/2018   Influenza, Seasonal, Injecte, Preservative Fre 05/09/2010, 04/09/2012   Influenza,inj,Quad PF,6+ Mos 04/23/2016, 04/21/2017   Influenza-Unspecified 07/08/2013, 05/16/2014, 04/23/2015   Moderna Sars-Covid-2 Vaccination 08/16/2019, 09/26/2019, 06/12/2020, 12/10/2020   Pneumococcal Conjugate-13 04/23/2015   Pneumococcal Polysaccharide-23 12/20/2012    Health Maintenance  Topic Date Due   Zoster Vaccines- Shingrix (1 of 2) 04/17/2021 (Originally 08/27/1950)   TETANUS/TDAP  01/15/2022 (Originally 08/27/1950)   DEXA SCAN  10/13/2028 (Originally 08/27/1998)   INFLUENZA VACCINE  03/04/2021   COVID-19 Vaccine (5 - Booster for Moderna series) 04/12/2021   PNA vac Low Risk Adult  Completed   HPV VACCINES  Aged Out       Assessment  This is a routine wellness examination for [employer].  Health Maintenance: Due or Overdue There are no preventive care reminders to display for this patient.   Alexandra Barber does not need a referral for Community  Assistance: Care Management:   no Social Work:    no Prescription Assistance:  no Nutrition/Diabetes Education:  no   Plan:  Personalized Goals  Goals Addressed               This Visit's Progress     Patient Stated (pt-stated)        01/15/2021 AWV Goal: Exercise for General Health  Patient will verbalize understanding of the benefits of increased physical activity: Exercising regularly is important. It will improve your overall fitness, flexibility, and endurance. Regular exercise also will improve your overall health. It can help you control your weight, reduce stress, and improve your bone density. Over the next year, patient will increase physical activity as tolerated with a goal of at least 150 minutes of moderate physical activity per week.  You can tell that you are exercising at a moderate intensity if your heart starts beating faster and you start breathing faster but can still hold a conversation. Moderate-intensity exercise ideas include: Walking 1 mile (1.6 km) in about 15 minutes Biking Hiking Golfing Dancing Water aerobics Patient will verbalize understanding of everyday activities that increase physical activity by providing examples like the following: Yard work, such as: Insurance underwriter Gardening Washing windows or floors Patient will be able to explain general safety guidelines for exercising:  Before you start a new exercise program, talk with your health care provider. Do not exercise so much that you hurt yourself, feel dizzy, or get very short of breath. Wear comfortable clothes and wear shoes with good support. Drink plenty of water while you exercise to prevent dehydration or heat stroke. Work out until your breathing and your heartbeat get faster.         Personalized Health Maintenance & Screening Recommendations  Influenza vaccine Td vaccine Bone  densitometry screening Shingrix vaccine  Patient declined the vaccines and the bone density screening at this time.  Lung Cancer Screening Recommended: no (Low Dose CT Chest recommended if Age 78-80 years, 30 pack-year currently smoking OR have quit w/in past 15 years) Hepatitis C Screening recommended: no HIV Screening recommended: no  Advanced Directives: Written information was not prepared per patient's request.  Referrals & Orders No orders of the defined types were placed in this encounter.  Follow-up Plan Follow-up with Christen ButterJessup, Joy, NP as planned Medicare wellness in one year.  AVS printed and mailed to the patient.   I have personally reviewed and noted the following in the patient's chart:   Medical and social history Use of alcohol, tobacco or illicit drugs  Current medications and supplements Functional ability and status Nutritional status Physical activity Advanced directives List of other physicians Hospitalizations, surgeries, and ER visits in previous 12 months Vitals Screenings to include cognitive, depression, and falls Referrals and appointments  In addition, I have reviewed and discussed with Alexandra CastillaGilette Zamarripa certain preventive protocols, quality metrics, and best practice recommendations. A written personalized care plan for preventive services as well as general preventive health recommendations is available and can be mailed to the patient at her request.      Modesto CharonBableen  Shant Hence, RN  01/15/2021

## 2021-01-15 NOTE — Patient Instructions (Signed)
  MEDICARE ANNUAL WELLNESS VISIT Health Maintenance Summary and Written Plan of Care  Alexandra Barber ,  Thank you for allowing me to perform your Medicare Annual Wellness Visit and for your ongoing commitment to your health.   Health Maintenance & Immunization History Health Maintenance  Topic Date Due   Zoster Vaccines- Shingrix (1 of 2) 04/17/2021 (Originally 08/27/1950)   TETANUS/TDAP  01/15/2022 (Originally 08/27/1950)   DEXA SCAN  10/13/2028 (Originally 08/27/1998)   INFLUENZA VACCINE  03/04/2021   COVID-19 Vaccine (5 - Booster for Moderna series) 04/12/2021   PNA vac Low Risk Adult  Completed   HPV VACCINES  Aged Out   Immunization History  Administered Date(s) Administered   Fluad Quad(high Dose 65+) 03/30/2019, 05/02/2020   Influenza, High Dose Seasonal PF 07/08/2013, 05/13/2014, 04/23/2015, 05/05/2018   Influenza, Seasonal, Injecte, Preservative Fre 05/09/2010, 04/09/2012   Influenza,inj,Quad PF,6+ Mos 04/23/2016, 04/21/2017   Influenza-Unspecified 07/08/2013, 05/16/2014, 04/23/2015   Moderna Sars-Covid-2 Vaccination 08/16/2019, 09/26/2019, 06/12/2020, 12/10/2020   Pneumococcal Conjugate-13 04/23/2015   Pneumococcal Polysaccharide-23 12/20/2012    These are the patient goals that we discussed:  Goals Addressed               This Visit's Progress     Patient Stated (pt-stated)        01/15/2021 AWV Goal: Exercise for General Health  Patient will verbalize understanding of the benefits of increased physical activity: Exercising regularly is important. It will improve your overall fitness, flexibility, and endurance. Regular exercise also will improve your overall health. It can help you control your weight, reduce stress, and improve your bone density. Over the next year, patient will increase physical activity as tolerated with a goal of at least 150 minutes of moderate physical activity per week.  You can tell that you are exercising at a moderate intensity if your  heart starts beating faster and you start breathing faster but can still hold a conversation. Moderate-intensity exercise ideas include: Walking 1 mile (1.6 km) in about 15 minutes Biking Hiking Golfing Dancing Water aerobics Patient will verbalize understanding of everyday activities that increase physical activity by providing examples like the following: Yard work, such as: Insurance underwriter Gardening Washing windows or floors Patient will be able to explain general safety guidelines for exercising:  Before you start a new exercise program, talk with your health care provider. Do not exercise so much that you hurt yourself, feel dizzy, or get very short of breath. Wear comfortable clothes and wear shoes with good support. Drink plenty of water while you exercise to prevent dehydration or heat stroke. Work out until your breathing and your heartbeat get faster.           This is a list of Health Maintenance Items that are overdue or due now: Influenza vaccine Td vaccine Bone densitometry screening Shingrix vaccine  Patient declined the vaccines and the bone density screening at this time.  Orders/Referrals Placed Today: No orders of the defined types were placed in this encounter.  (Contact our referral department at [phone] if you have not spoken with someone about your referral appointment within the next 5 days)    Follow-up Plan Follow-up with Christen Butter, NP as planned Medicare wellness in one year.  AVS printed and mailed to the patient.

## 2021-04-24 ENCOUNTER — Ambulatory Visit (INDEPENDENT_AMBULATORY_CARE_PROVIDER_SITE_OTHER): Payer: Medicare Other | Admitting: Medical-Surgical

## 2021-04-24 ENCOUNTER — Encounter: Payer: Self-pay | Admitting: Medical-Surgical

## 2021-04-24 ENCOUNTER — Other Ambulatory Visit: Payer: Self-pay

## 2021-04-24 VITALS — BP 137/65 | HR 63 | Resp 20 | Ht 62.0 in | Wt 100.0 lb

## 2021-04-24 DIAGNOSIS — Z23 Encounter for immunization: Secondary | ICD-10-CM

## 2021-04-24 DIAGNOSIS — I10 Essential (primary) hypertension: Secondary | ICD-10-CM | POA: Diagnosis not present

## 2021-04-24 DIAGNOSIS — M810 Age-related osteoporosis without current pathological fracture: Secondary | ICD-10-CM

## 2021-04-24 DIAGNOSIS — Z0000 Encounter for general adult medical examination without abnormal findings: Secondary | ICD-10-CM | POA: Diagnosis not present

## 2021-04-24 DIAGNOSIS — E782 Mixed hyperlipidemia: Secondary | ICD-10-CM | POA: Diagnosis not present

## 2021-04-24 LAB — LIPID PANEL
Cholesterol: 238 mg/dL — ABNORMAL HIGH (ref <200)
HDL: 79 mg/dL (ref >=50)
LDL Cholesterol (Calc): 143 mg/dL (calc) — ABNORMAL HIGH
Non-HDL Cholesterol (Calc): 159 mg/dL (calc) — ABNORMAL HIGH (ref <130)
Total CHOL/HDL Ratio: 3 (calc) (ref <5.0)
Triglycerides: 66 mg/dL (ref <150)

## 2021-04-24 LAB — CBC WITH DIFFERENTIAL/PLATELET
Absolute Monocytes: 377 cells/uL (ref 200–950)
Basophils Absolute: 41 cells/uL (ref 0–200)
Basophils Relative: 1.2 %
Eosinophils Absolute: 51 cells/uL (ref 15–500)
Eosinophils Relative: 1.5 %
HCT: 35 % (ref 35.0–45.0)
Hemoglobin: 11.2 g/dL — ABNORMAL LOW (ref 11.7–15.5)
Lymphs Abs: 1547 cells/uL (ref 850–3900)
MCH: 26.7 pg — ABNORMAL LOW (ref 27.0–33.0)
MCHC: 32 g/dL (ref 32.0–36.0)
MCV: 83.5 fL (ref 80.0–100.0)
MPV: 11.1 fL (ref 7.5–12.5)
Monocytes Relative: 11.1 %
Neutro Abs: 1384 cells/uL — ABNORMAL LOW (ref 1500–7800)
Neutrophils Relative %: 40.7 %
Platelets: 229 10*3/uL (ref 140–400)
RBC: 4.19 10*6/uL (ref 3.80–5.10)
RDW: 13.4 % (ref 11.0–15.0)
Total Lymphocyte: 45.5 %
WBC: 3.4 10*3/uL — ABNORMAL LOW (ref 3.8–10.8)

## 2021-04-24 LAB — COMPLETE METABOLIC PANEL WITH GFR
AG Ratio: 1.6 (calc) (ref 1.0–2.5)
ALT: 9 U/L (ref 6–29)
AST: 20 U/L (ref 10–35)
Albumin: 4.7 g/dL (ref 3.6–5.1)
Alkaline phosphatase (APISO): 37 U/L (ref 37–153)
BUN: 12 mg/dL (ref 7–25)
CO2: 27 mmol/L (ref 20–32)
Calcium: 10.3 mg/dL (ref 8.6–10.4)
Chloride: 102 mmol/L (ref 98–110)
Creat: 0.74 mg/dL (ref 0.60–0.95)
Globulin: 2.9 g/dL (calc) (ref 1.9–3.7)
Glucose, Bld: 71 mg/dL (ref 65–99)
Potassium: 4.4 mmol/L (ref 3.5–5.3)
Sodium: 137 mmol/L (ref 135–146)
Total Bilirubin: 0.8 mg/dL (ref 0.2–1.2)
Total Protein: 7.6 g/dL (ref 6.1–8.1)
eGFR: 78 mL/min/{1.73_m2} (ref >=60)

## 2021-04-24 MED ORDER — SHINGRIX 50 MCG/0.5ML IM SUSR: 0.5000 mL | Freq: Once | INTRAMUSCULAR | 1 refills | Status: AC

## 2021-04-24 NOTE — Patient Instructions (Signed)
Preventive Care 85 Years and Older, Female Preventive care refers to lifestyle choices and visits with your health care provider that can promote health and wellness. This includes: A yearly physical exam. This is also called an annual wellness visit. Regular dental and eye exams. Immunizations. Screening for certain conditions. Healthy lifestyle choices, such as: Eating a healthy diet. Getting regular exercise. Not using drugs or products that contain nicotine and tobacco. Limiting alcohol use. What can I expect for my preventive care visit? Physical exam Your health care provider will check your: Height and weight. These may be used to calculate your BMI (body mass index). BMI is a measurement that tells if you are at a healthy weight. Heart rate and blood pressure. Body temperature. Skin for abnormal spots. Counseling Your health care provider may ask you questions about your: Past medical problems. Family's medical history. Alcohol, tobacco, and drug use. Emotional well-being. Home life and relationship well-being. Sexual activity. Diet, exercise, and sleep habits. History of falls. Memory and ability to understand (cognition). Work and work Statistician. Pregnancy and menstrual history. Access to firearms. What immunizations do I need? Vaccines are usually given at various ages, according to a schedule. Your health care provider will recommend vaccines for you based on your age, medical history, and lifestyle or other factors, such as travel or where you work. What tests do I need? Blood tests Lipid and cholesterol levels. These may be checked every 5 years, or more often depending on your overall health. Hepatitis C test. Hepatitis B test. Screening Lung cancer screening. You may have this screening every year starting at age 85 if you have a 30-pack-year history of smoking and currently smoke or have quit within the past 15 years. Colorectal cancer screening. All  adults should have this screening starting at age 85 and continuing until age 85. Your health care provider may recommend screening at age 85 if you are at increased risk. You will have tests every 1-10 years, depending on your results and the type of screening test. Diabetes screening. This is done by checking your blood sugar (glucose) after you have not eaten for a while (fasting). You may have this done every 1-3 years. Mammogram. This may be done every 1-2 years. Talk with your health care provider about how often you should have regular mammograms. Abdominal aortic aneurysm (AAA) screening. You may need this if you are a current or former smoker. BRCA-related cancer screening. This may be done if you have a family history of breast, ovarian, tubal, or peritoneal cancers. Other tests STD (sexually transmitted disease) testing, if you are at risk. Bone density scan. This is done to screen for osteoporosis. You may have this done starting at age 85. Talk with your health care provider about your test results, treatment options, and if necessary, the need for more tests. Follow these instructions at home: Eating and drinking  Eat a diet that includes fresh fruits and vegetables, whole grains, lean protein, and low-fat dairy products. Limit your intake of foods with high amounts of sugar, saturated fats, and salt. Take vitamin and mineral supplements as recommended by your health care provider. Do not drink alcohol if your health care provider tells you not to drink. If you drink alcohol: Limit how much you have to 0-1 drink a day. Be aware of how much alcohol is in your drink. In the [place] one drink equals one 12 oz bottle of beer (355 mL), one 5 oz glass of wine (148 mL), or one  1 oz glass of hard liquor (44 mL). Lifestyle Take daily care of your teeth and gums. Brush your teeth every morning and night with fluoride toothpaste. Floss one time each day. Stay active. Exercise for at least  30 minutes 5 or more days each week. Do not use any products that contain nicotine or tobacco, such as cigarettes, e-cigarettes, and chewing tobacco. If you need help quitting, ask your health care provider. Do not use drugs. If you are sexually active, practice safe sex. Use a condom or other form of protection in order to prevent STIs (sexually transmitted infections). Talk with your health care provider about taking a low-dose aspirin or statin. Find healthy ways to cope with stress, such as: Meditation, yoga, or listening to music. Journaling. Talking to a trusted person. Spending time with friends and family. Safety Always wear your seat belt while driving or riding in a vehicle. Do not drive: If you have been drinking alcohol. Do not ride with someone who has been drinking. When you are tired or distracted. While texting. Wear a helmet and other protective equipment during sports activities. If you have firearms in your house, make sure you follow all gun safety procedures. What's next? Visit your health care provider once a year for an annual wellness visit. Ask your health care provider how often you should have your eyes and teeth checked. Stay up to date on all vaccines. This information is not intended to replace advice given to you by your health care provider. Make sure you discuss any questions you have with your health care provider. Document Revised: 09/28/2020 Document Reviewed: 07/15/2018 Elsevier Patient Education  2022 [employer].

## 2021-04-24 NOTE — Progress Notes (Signed)
HPI: Alexandra Barber is a 87 y.o. female who  has a past medical history of Asthma.  she presents to [facility] today, 04/24/21,  for chief complaint of: Annual physical exam  Dentist: has full dentures, doesn't wear them most of the time Eye exam: wears glasses, yearly exams Exercise: regular dancing and gym exercises Diet: healthy choices COVID vaccine:  Done, 2 boosters completed  Concerns: None  Past medical, surgical, social and family history reviewed:  Patient Active Problem List   Diagnosis Date Noted   Hyperlipidemia 09/09/2016   Hypertension 09/09/2016   Osteoarthritis 09/09/2016   Osteoporosis 09/09/2016   History of iron deficiency anemia 09/09/2016    Past Surgical History:  Procedure Laterality Date   ABDOMINAL HYSTERECTOMY     BREAST EXCISIONAL BIOPSY Right    in her 81- over 30 years ago   KNEE SURGERY     ovary removed Bilateral    when age 27   ROTATOR CUFF REPAIR Left     Social History   Tobacco Use   Smoking status: Former   Smokeless tobacco: Never  Substance Use Topics   Alcohol use: No    Family History  Problem Relation Age of Onset   Cancer Mother    Breast cancer Mother      Current medication list and allergy/intolerance information reviewed:    Current Outpatient Medications  Medication Sig Dispense Refill   calcium carbonate (TUMS EX) 750 MG chewable tablet Chew 2 tablets by mouth as needed for heartburn.     cholecalciferol (VITAMIN D3) 25 MCG (1000 UNIT) tablet Take 1,000 Units by mouth daily.     lisinopril (ZESTRIL) 2.5 MG tablet Take 1 tablet (2.5 mg total) by mouth daily. 90 tablet 3   Omega-3 Fatty Acids (OMEGA 3 500) 500 MG CAPS Take 2 capsules daily (Patient taking differently: Take 2 capsules daily  Takes one capsule) 60 capsule    pravastatin (PRAVACHOL) 20 MG tablet Take 1 tablet (20 mg total) by mouth daily. 90 tablet 3   Zoster Vaccine Adjuvanted [facility]) injection  Inject 0.5 mLs into the muscle once for 1 dose. Repeat in 2 to 6 months 0.5 mL 1   No current facility-administered medications for this visit.    Allergies  Allergen Reactions   Hctz [Hydrochlorothiazide]    Lovastatin    Zetia [Ezetimibe]       Review of Systems: Constitutional:  No  fever, no chills, No recent illness, No unintentional weight changes. No significant fatigue.  HEENT: No  headache, no vision change, no hearing change, No sore throat, No  sinus pressure Cardiac: No  chest pain, No  pressure, No palpitations, No  Orthopnea Respiratory:  No  shortness of breath. No  Cough Gastrointestinal: No  abdominal pain, No  nausea, No  vomiting,  No  blood in stool, No  diarrhea, No  constipation  Musculoskeletal: No new myalgia/arthralgia Skin: No  Rash, No other wounds/concerning lesions Genitourinary: No  incontinence, No  abnormal genital bleeding, No abnormal genital discharge Hem/Onc: No  easy bruising/bleeding, No  abnormal lymph node Endocrine: No cold intolerance,  No heat intolerance. No polyuria/polydipsia/polyphagia  Neurologic: No  weakness, No  dizziness, No  slurred speech/focal weakness/facial droop Psychiatric: No  concerns with depression, No  concerns with anxiety, No sleep problems, No mood problems  Exam:  BP 137/65 (BP Location: Left Arm, Patient Position: Sitting, Cuff Size: Small)   Pulse 63   Resp 20   Ht  _0  (1.575 m)   Wt 100 lb (45.4 kg)   SpO2 98%   BMI 18.29 kg/m  Constitutional: VS see above. General Appearance: alert, well-developed, well-nourished, NAD Eyes: Normal lids and conjunctive, non-icteric sclera Ears, Nose, Mouth, Throat: MMM, Normal external inspection ears/nares/mouth/lips/gums. TM normal bilaterally.   Neck: No masses, trachea midline. No thyroid enlargement. No tenderness/mass appreciated. No lymphadenopathy Respiratory: Normal respiratory effort. no wheeze, no rhonchi, no rales Cardiovascular: S1/S2 normal, no murmur, no  rub/gallop auscultated. RRR. No lower extremity edema. Pedal pulse II/IV bilaterally PT. No carotid bruit or JVD. No abdominal aortic bruit. Gastrointestinal: Nontender, no masses. No hepatomegaly, no splenomegaly. No hernia appreciated. Bowel sounds normal. Rectal exam deferred.  Musculoskeletal: Gait normal. No clubbing/cyanosis of digits.  Neurological: Normal balance/coordination. No tremor. No cranial nerve deficit on limited exam. Motor and sensation intact and symmetric. Cerebellar reflexes intact.  Skin: warm, dry, intact. No rash/ulcer. No concerning nevi or subq nodules on limited exam.   Psychiatric: Normal judgment/insight. Normal mood and affect. Oriented x3.    ASSESSMENT/PLAN:   1. Annual physical exam Checking labs. Up to date on preventative care. Printed prescription for Shingrix provided to patient to take to her pharmacy and evaluate the cost. - CBC with Differential/Platelet - COMPLETE METABOLIC PANEL WITH GFR - Lipid panel  2. Primary hypertension Stable.  Continue lisinopril 2.5 mg daily.  3. Mixed hyperlipidemia Checking lipid panel.  Continue pravastatin 20 mg daily. - Lipid panel  4. Age-related osteoporosis without current pathological fracture Up-to-date on DEXA scans.  Continue vitamin D and calcium supplements daily  5. Flu vaccine need Flu vaccine given in office. - Flu Vaccine QUAD High Dose(Fluad)   Orders Placed This Encounter  Procedures   Flu Vaccine QUAD High Dose(Fluad)   CBC with Differential/Platelet   COMPLETE METABOLIC PANEL WITH GFR   Lipid panel    Meds ordered this encounter  Medications   Zoster Vaccine Adjuvanted [facility]) injection    Sig: Inject 0.5 mLs into the muscle once for 1 dose. Repeat in 2 to 6 months    Dispense:  0.5 mL    Refill:  1    Order Specific Question:   Supervising Provider    Answer:   Luetta Nutting [[id number]]    Patient Instructions  Preventive Care 37 Years and Older, Female Preventive care refers  to lifestyle choices and visits with your health care provider that can promote health and wellness. This includes: A yearly physical exam. This is also called an annual wellness visit. Regular dental and eye exams. Immunizations. Screening for certain conditions. Healthy lifestyle choices, such as: Eating a healthy diet. Getting regular exercise. Not using drugs or products that contain nicotine and tobacco. Limiting alcohol use. What can I expect for my preventive care visit? Physical exam Your health care provider will check your: Height and weight. These may be used to calculate your BMI (body mass index). BMI is a measurement that tells if you are at a healthy weight. Heart rate and blood pressure. Body temperature. Skin for abnormal spots. Counseling Your health care provider may ask you questions about your: Past medical problems. Family's medical history. Alcohol, tobacco, and drug use. Emotional well-being. Home life and relationship well-being. Sexual activity. Diet, exercise, and sleep habits. History of falls. Memory and ability to understand (cognition). Work and work Statistician. Pregnancy and menstrual history. Access to firearms. What immunizations do I need? Vaccines are usually given at various ages, according to a schedule. Your health care provider  will recommend vaccines for you based on your age, medical history, and lifestyle or other factors, such as travel or where you work. What tests do I need? Blood tests Lipid and cholesterol levels. These may be checked every 5 years, or more often depending on your overall health. Hepatitis C test. Hepatitis B test. Screening Lung cancer screening. You may have this screening every year starting at age 2 if you have a 30-pack-year history of smoking and currently smoke or have quit within the past 15 years. Colorectal cancer screening. All adults should have this screening starting at age 68 and continuing  until age 49. Your health care provider may recommend screening at age 66 if you are at increased risk. You will have tests every 1-10 years, depending on your results and the type of screening test. Diabetes screening. This is done by checking your blood sugar (glucose) after you have not eaten for a while (fasting). You may have this done every 1-3 years. Mammogram. This may be done every 1-2 years. Talk with your health care provider about how often you should have regular mammograms. Abdominal aortic aneurysm (AAA) screening. You may need this if you are a current or former smoker. BRCA-related cancer screening. This may be done if you have a family history of breast, ovarian, tubal, or peritoneal cancers. Other tests STD (sexually transmitted disease) testing, if you are at risk. Bone density scan. This is done to screen for osteoporosis. You may have this done starting at age 41. Talk with your health care provider about your test results, treatment options, and if necessary, the need for more tests. Follow these instructions at home: Eating and drinking  Eat a diet that includes fresh fruits and vegetables, whole grains, lean protein, and low-fat dairy products. Limit your intake of foods with high amounts of sugar, saturated fats, and salt. Take vitamin and mineral supplements as recommended by your health care provider. Do not drink alcohol if your health care provider tells you not to drink. If you drink alcohol: Limit how much you have to 0-1 drink a day. Be aware of how much alcohol is in your drink. In the [place] one drink equals one 12 oz bottle of beer (355 mL), one 5 oz glass of wine (148 mL), or one 1 oz glass of hard liquor (44 mL). Lifestyle Take daily care of your teeth and gums. Brush your teeth every morning and night with fluoride toothpaste. Floss one time each day. Stay active. Exercise for at least 30 minutes 5 or more days each week. Do not use any products that  contain nicotine or tobacco, such as cigarettes, e-cigarettes, and chewing tobacco. If you need help quitting, ask your health care provider. Do not use drugs. If you are sexually active, practice safe sex. Use a condom or other form of protection in order to prevent STIs (sexually transmitted infections). Talk with your health care provider about taking a low-dose aspirin or statin. Find healthy ways to cope with stress, such as: Meditation, yoga, or listening to music. Journaling. Talking to a trusted person. Spending time with friends and family. Safety Always wear your seat belt while driving or riding in a vehicle. Do not drive: If you have been drinking alcohol. Do not ride with someone who has been drinking. When you are tired or distracted. While texting. Wear a helmet and other protective equipment during sports activities. If you have firearms in your house, make sure you follow all gun safety procedures. What's  next? Visit your health care provider once a year for an annual wellness visit. Ask your health care provider how often you should have your eyes and teeth checked. Stay up to date on all vaccines. This information is not intended to replace advice given to you by your health care provider. Make sure you discuss any questions you have with your health care provider. Document Revised: 09/28/2020 Document Reviewed: 07/15/2018 Elsevier Patient Education  2022 [employer].  Follow-up plan: Return in about 6 months (around 10/22/2021) for HTN follow up.  Clearnce Sorrel, DNP, APRN, FNP-BC [facility]

## 2021-07-02 ENCOUNTER — Other Ambulatory Visit: Payer: Self-pay | Admitting: Medical-Surgical

## 2021-07-02 DIAGNOSIS — Z1231 Encounter for screening mammogram for malignant neoplasm of breast: Secondary | ICD-10-CM

## 2021-07-31 DIAGNOSIS — H5203 Hypermetropia, bilateral: Secondary | ICD-10-CM | POA: Diagnosis not present

## 2021-08-12 ENCOUNTER — Ambulatory Visit
Admission: RE | Admit: 2021-08-12 | Discharge: 2021-08-12 | Disposition: A | Discharge status: Home / Self Care | Payer: Medicare Other | Source: Ambulatory Visit | Attending: Medical-Surgical | Admitting: Medical-Surgical

## 2021-08-12 DIAGNOSIS — Z1231 Encounter for screening mammogram for malignant neoplasm of breast: Secondary | ICD-10-CM | POA: Diagnosis not present

## 2021-10-22 ENCOUNTER — Ambulatory Visit: Payer: Medicare Other | Admitting: Medical-Surgical

## 2021-10-29 ENCOUNTER — Other Ambulatory Visit: Payer: Self-pay

## 2021-10-29 ENCOUNTER — Ambulatory Visit (INDEPENDENT_AMBULATORY_CARE_PROVIDER_SITE_OTHER): Payer: Medicare Other | Admitting: Medical-Surgical

## 2021-10-29 ENCOUNTER — Encounter: Payer: Self-pay | Admitting: Medical-Surgical

## 2021-10-29 VITALS — BP 129/75 | HR 60 | Ht 62.0 in | Wt 98.0 lb

## 2021-10-29 DIAGNOSIS — I10 Essential (primary) hypertension: Secondary | ICD-10-CM | POA: Diagnosis not present

## 2021-10-29 DIAGNOSIS — E782 Mixed hyperlipidemia: Secondary | ICD-10-CM

## 2021-10-29 NOTE — Progress Notes (Signed)
?  HPI with pertinent ROS:  ? ?CC: HTN/HLD follow up  ? ?HPI: Pleasant 88 y.o. female presenting for HTN/HLD follow up. Takes Pravastatin once daily for hyperlipidemia. Tolerating well with no side effects. Taking Lisinopril once daily for hypertension. Denies chest pain, SOB, headache, palpitations, or lower leg edema. Has a cuff at home, however she is not taking blood pressure at home. 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 Her diet consists of fruit, vegetables, fish, and oatmeal. She does not salt her food or eat fried food.  ? ?Reports waking up having neck pain last month and believes she slept on it wrong. She saw a chiropractor the last time this happened and they gave her exercises to do at home. She tried the exercises and heat and reports relief of her neck pain. ? ?At her last visit with the eye doctor he told her she has cataract, but she reports they said that it is not bad enough to warrant treatment now unless she would like treatment. She says it does not really bother her, she is not having trouble seeing anything. She does not drive anymore. She will follow up with the doctor next visit to see if she needs treatment.  ? ? ?I reviewed the past medical history, family history, social history, surgical history, and allergies today and no changes were needed.  Please see the problem list section below in epic for further details. ? ? ?Physical exam:  ? ?General: Well Developed, well nourished, and in no acute distress.  ?Neuro: Alert and oriented x3 ?HEENT: Normocephalic, atraumatic ?Skin: Warm and dry, no rashes. ?Cardiac: Regular rate and rhythm, no murmurs rubs or gallops, no lower extremity edema.  ?Respiratory: Clear to auscultation bilaterally. Not using accessory muscles, speaking in full sentences. ? ?Impression and Recommendations:   ? ?1. Primary hypertension ?Continue Lisinopril 2.5mg  once daily. Continue dietary changes and exercise  routine.  ?Checking labs today ?-Complete blood count ?-Complete metabolic panel with GFR ? ?2. Mixed hyperlipidemia ?Continue Pravastatin 20mg  once daily. Checking labs today ?-Lipid panel  ? ? ?Return in about 6 months (around 05/01/2022) for HTN follow up. ?___________________________________________ ?05/03/2022, Student NP  ?

## 2021-10-29 NOTE — Progress Notes (Signed)
Medical screening examination/treatment was performed by qualified nurse practitioner student and as supervising provider I was immediately available for consultation/collaboration. I have reviewed documentation and agree with assessment and plan. ° °Roslynn Holte L. Loris Winrow, DNP, APRN, FNP-BC °[facility] °Primary Care and Sports Medicine ° °

## 2021-10-30 LAB — COMPLETE METABOLIC PANEL WITH GFR
AG Ratio: 1.6 (calc) (ref 1.0–2.5)
ALT: 9 U/L (ref 6–29)
AST: 19 U/L (ref 10–35)
Albumin: 4.6 g/dL (ref 3.6–5.1)
Alkaline phosphatase (APISO): 35 U/L — ABNORMAL LOW (ref 37–153)
BUN: 13 mg/dL (ref 7–25)
CO2: 26 mmol/L (ref 20–32)
Calcium: 9.9 mg/dL (ref 8.6–10.4)
Chloride: 103 mmol/L (ref 98–110)
Creat: 0.72 mg/dL (ref 0.60–0.95)
Globulin: 2.8 g/dL (calc) (ref 1.9–3.7)
Glucose, Bld: 82 mg/dL (ref 65–99)
Potassium: 4 mmol/L (ref 3.5–5.3)
Sodium: 139 mmol/L (ref 135–146)
Total Bilirubin: 0.8 mg/dL (ref 0.2–1.2)
Total Protein: 7.4 g/dL (ref 6.1–8.1)
eGFR: 80 mL/min/{1.73_m2} (ref >=60)

## 2021-10-30 LAB — LIPID PANEL
Cholesterol: 235 mg/dL — ABNORMAL HIGH (ref <200)
HDL: 84 mg/dL (ref >=50)
LDL Cholesterol (Calc): 135 mg/dL (calc) — ABNORMAL HIGH
Non-HDL Cholesterol (Calc): 151 mg/dL (calc) — ABNORMAL HIGH (ref <130)
Total CHOL/HDL Ratio: 2.8 (calc) (ref <5.0)
Triglycerides: 66 mg/dL (ref <150)

## 2021-10-30 LAB — CBC
HCT: 34.2 % — ABNORMAL LOW (ref 35.0–45.0)
Hemoglobin: 10.8 g/dL — ABNORMAL LOW (ref 11.7–15.5)
MCH: 26.9 pg — ABNORMAL LOW (ref 27.0–33.0)
MCHC: 31.6 g/dL — ABNORMAL LOW (ref 32.0–36.0)
MCV: 85.1 fL (ref 80.0–100.0)
MPV: 10.9 fL (ref 7.5–12.5)
Platelets: 222 10*3/uL (ref 140–400)
RBC: 4.02 10*6/uL (ref 3.80–5.10)
RDW: 13.2 % (ref 11.0–15.0)
WBC: 4.2 10*3/uL (ref 3.8–10.8)

## 2021-11-22 ENCOUNTER — Ambulatory Visit (INDEPENDENT_AMBULATORY_CARE_PROVIDER_SITE_OTHER): Payer: Medicare Other | Admitting: Medical-Surgical

## 2021-11-22 ENCOUNTER — Ambulatory Visit (INDEPENDENT_AMBULATORY_CARE_PROVIDER_SITE_OTHER): Payer: Medicare Other

## 2021-11-22 ENCOUNTER — Encounter: Payer: Self-pay | Admitting: Medical-Surgical

## 2021-11-22 VITALS — BP 147/77 | HR 64 | Resp 20 | Ht 62.0 in | Wt 100.4 lb

## 2021-11-22 DIAGNOSIS — W19XXXA Unspecified fall, initial encounter: Secondary | ICD-10-CM | POA: Diagnosis not present

## 2021-11-22 DIAGNOSIS — M25561 Pain in right knee: Secondary | ICD-10-CM

## 2021-11-22 DIAGNOSIS — M1711 Unilateral primary osteoarthritis, right knee: Secondary | ICD-10-CM | POA: Diagnosis not present

## 2021-11-22 DIAGNOSIS — R29898 Other symptoms and signs involving the musculoskeletal system: Secondary | ICD-10-CM | POA: Insufficient documentation

## 2021-11-22 HISTORY — DX: Other symptoms and signs involving the musculoskeletal system: R29.898

## 2021-11-22 NOTE — Progress Notes (Signed)
?  HPI with pertinent ROS:  ? ?CC: knee pain ? ?HPI: ?Pleasant 88 year old female presenting today for evaluation of right knee pain. She had a fall outside at home about 8 days ago. She was taking out the trash and her shoe caught on the sidewalk. She was unable to catch herself and landed on her right side from hip to knee. She was able to get up and walk but noted it was sore. She tried to go do her regular exercises but had to stop early on due to knee pain. She had difficulty getting up that evening and ended up using a crutch to try to get around the house. For the last week she has been doing exercises to get her mobility back and is now back to walking but is using a walker for support when needed. The pain is in the back of her knee and feels deep. Not tender to touch but hurts when weight bearing. Has also noted a popping and protrusion of the muscle at the inside of the knee and thigh that she had not noticed before. It does not hurt when she moves or when pushed on.  ? ?I reviewed the past medical history, family history, social history, surgical history, and allergies today and no changes were needed.  Please see the problem list section below in epic for further details. ? ? ?Physical exam:  ? ?General: Well Developed, well nourished, and in no acute distress.  ?Neuro: Alert and oriented x3.  ?HEENT: Normocephalic, atraumatic.  ?Skin: Warm and dry. ?Cardiac: Regular rate and rhythm, no murmurs rubs or gallops, no lower extremity edema.  ?Respiratory: Clear to auscultation bilaterally. Not using accessory muscles, speaking in full sentences. ?Right knee: Full ROM without catching or locking. Nontender to palpation. No redness, swelling, or fluctuance noted.  ? ?Impression and Recommendations:   ? ?1. Popping of right knee joint ?2. Posterior right knee pain ?Consulted Dr. Benjamin Stain for in office evaluation. Getting x-rays today. Ok to  use Tylenol, heat, ice, compression, etc for pain control. Home  exercises printed and provided to patient with instructions to do them at least daily and if no improvement in 4 weeks, return for further evaluation.  ?- DG Knee Complete 4 Views Right; Future ? ?Return if symptoms worsen or fail to improve. ?___________________________________________ ?Thayer Ohm, DNP, APRN, FNP-BC ?Primary Care and Sports Medicine ?[facility] ?

## 2021-11-22 NOTE — Assessment & Plan Note (Signed)
Pleasant 88 year old female, noting popping sensation medial knee, no pain. ?On exam she does have some popping of the sartorius, gracilis, and semitendinosus/Pes anserine tendons over the medial femoral condyle. ?Her knee is stable, she has no tenderness at the pes anserine bursa. ?No intervention needed, we will get some baseline x-rays.   ?I will also add some Pes anserine bursa conditioning exercises. ?

## 2021-12-04 ENCOUNTER — Other Ambulatory Visit: Payer: Self-pay

## 2021-12-04 ENCOUNTER — Telehealth: Payer: Self-pay | Admitting: Medical-Surgical

## 2021-12-04 MED ORDER — LISINOPRIL 2.5 MG PO TABS: 2.5000 mg | ORAL_TABLET | Freq: Every day | ORAL | 3 refills | Status: DC

## 2021-12-04 MED ORDER — PRAVASTATIN SODIUM 20 MG PO TABS: 20.0000 mg | ORAL_TABLET | Freq: Every day | ORAL | 3 refills | Status: DC

## 2021-12-04 NOTE — Telephone Encounter (Signed)
Pt called. She needs a refill on her Lisinopril and Pravastatin.  Her pharmacy has contacted [place] but have not gotten a response. Its been about 2 weeks.   She has been stretching her medicine to make it last. ?

## 2022-01-20 ENCOUNTER — Ambulatory Visit (INDEPENDENT_AMBULATORY_CARE_PROVIDER_SITE_OTHER): Payer: Medicare Other | Admitting: Family Medicine

## 2022-01-20 VITALS — BP 130/78 | HR 68 | Ht 62.0 in | Wt 100.1 lb

## 2022-01-20 DIAGNOSIS — Z0000 Encounter for general adult medical examination without abnormal findings: Secondary | ICD-10-CM

## 2022-01-20 NOTE — Progress Notes (Signed)
MEDICARE ANNUAL WELLNESS VISIT  01/20/2022  Subjective:  Alexandra Barber is a 88 y.o. female patient of Christen Butter, NP who had a Medicare Annual Wellness Visit today. Laiyah is Retired and lives with their spouse. she had one adopted child and she has passed away. she reports that she is socially active and does interact with friends/family regularly. she is moderately physically active and enjoys dance and ministry.  Patient Care Team: Christen Butter, NP as PCP - General (Nurse Practitioner)     01/20/2022    9:50 AM 01/15/2021   10:49 AM 10/22/2020    9:23 AM 09/30/2019    8:29 AM 08/15/2019   11:48 AM  Advanced Directives  Does Patient Have a Medical Advance Directive? Yes Yes Yes Yes Yes  Type of Advance Directive Living will;Healthcare Power of Attorney Living will Living will  Living will;Healthcare Power of [place];Out of facility DNR (pink MOST or yellow form)  Does patient want to make changes to medical advance directive? No - Patient declined No - Patient declined No - Patient declined No - Patient declined   Copy of Healthcare Power of Attorney in Chart? Yes - validated most recent copy scanned in chart (See row information)        Hospital Utilization Over the Past 12 Months: # of hospitalizations or ER visits: 0 # of surgeries: 0  Review of Systems    Patient reports that her overall health is unchanged when compared to last year.  Review of Systems: History obtained from chart review and the patient  All other systems negative.  Pain Assessment Pain : No/denies pain     Current Medications & Allergies (verified) Allergies as of 01/20/2022       Reactions   Hctz [hydrochlorothiazide]    Lovastatin    Zetia [ezetimibe]         Medication List        Accurate as of January 20, 2022 10:11 AM. If you have any questions, ask your nurse or doctor.          calcium carbonate 750 MG chewable tablet Commonly known as: TUMS EX Chew 2 tablets by mouth as  needed for heartburn.   cholecalciferol 25 MCG (1000 UNIT) tablet Commonly known as: VITAMIN D3 Take 1,000 Units by mouth daily.   lisinopril 2.5 MG tablet Commonly known as: ZESTRIL Take 1 tablet (2.5 mg total) by mouth daily.   Omega 3 500 500 MG Caps Take 2 capsules daily What changed: additional instructions   pravastatin 20 MG tablet Commonly known as: PRAVACHOL Take 1 tablet (20 mg total) by mouth daily.        History (reviewed): Past Medical History:  Diagnosis Date   Asthma    Past Surgical History:  Procedure Laterality Date   ABDOMINAL HYSTERECTOMY     BREAST EXCISIONAL BIOPSY Right    in her 89- over 60 years ago   KNEE SURGERY     ovary removed Bilateral    when age 80   ROTATOR CUFF REPAIR Left    Family History  Problem Relation Age of Onset   Cancer Mother    Breast cancer Mother    Social History   Socioeconomic History   Marital status: Married    Spouse name: [name]   Number of children: 1   Years of education: 8   Highest education level: 8th grade  Occupational History    Comment: retired  Tobacco Use   Smoking status: Former   Smokeless  tobacco: Never  Substance and Sexual Activity   Alcohol use: No   Drug use: No   Sexual activity: Not on file  Other Topics Concern   Not on file  Social History Narrative   Lives with her husband. She and her husband, they live with in a senior retirement community center. Her daughter passed away at the age of 24. 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   Social Determinants of Health   Financial Resource Strain: Low Risk  (01/20/2022)   Overall Financial Resource Strain (CARDIA)    Difficulty of Paying Living Expenses: Not hard at all  Food Insecurity: No Food Insecurity (01/20/2022)   Hunger Vital Sign    Worried About Running Out of Food in the Last Year: Never true    Ran Out of Food in the Last Year: Never true  Transportation Needs: No Transportation Needs (01/20/2022)    PRAPARE - Administrator, Civil Service (Medical): No    Lack of Transportation (Non-Medical): No  Physical Activity: Insufficiently Active (01/20/2022)   Exercise Vital Sign    Days of Exercise per Week: 3 days    Minutes of Exercise per Session: 30 min  Stress: No Stress Concern Present (01/20/2022)   [employer] of Occupational Health - Occupational Stress Questionnaire    Feeling of Stress : Not at all  Social Connections: Socially Integrated (01/20/2022)   Social Connection and Isolation Panel [NHANES]    Frequency of Communication with Friends and Family: More than three times a week    Frequency of Social Gatherings with Friends and Family: More than three times a week    Attends Religious Services: More than 4 times per year    Active Member of [employer] or Organizations: Yes    Attends Banker Meetings: More than 4 times per year    Marital Status: Married    Activities of Daily Living    01/20/2022    9:56 AM  In your present state of health, do you have any difficulty performing the following activities:  Hearing? 0  Vision? 0  Difficulty concentrating or making decisions? 0  Walking or climbing stairs? 0  Dressing or bathing? 0  Doing errands, shopping? 0  Preparing Food and eating ? N  Using the Toilet? N  In the past six months, have you accidently leaked urine? N  Do you have problems with loss of bowel control? N  Managing your Medications? N  Managing your Finances? N  Housekeeping or managing your Housekeeping? N    Patient Education/Literacy How often do you need to have someone help you when you read instructions, pamphlets, or other written materials from your doctor or pharmacy?: 1 - Never What is the last grade level you completed in school?: 8th grade  Exercise Current Exercise Habits: Home exercise routine, Type of exercise: walking;Other - see comments (dance), Time (Minutes): 30, Frequency (Times/Week): 6, Weekly Exercise  (Minutes/Week): 180, Intensity: Moderate, Exercise limited by: None identified  Diet Patient reports consuming 2 meals a day and 1 snack(s) a day Patient reports that her primary diet is: Regular Patient reports that she does have regular access to food.   Depression Screen    01/20/2022    9:51 AM 11/22/2021   10:51 AM 10/29/2021    9:51 AM 01/15/2021   10:50 AM 10/22/2020    9:23 AM 09/26/2020    8:54 AM 09/30/2019    8:26 AM  PHQ 2/9 Scores  PHQ - 2 Score 0 0 0 0 1 0 0  PHQ- 9 Score     2  0     Fall Risk    01/20/2022    9:51 AM 11/22/2021   10:51 AM 10/29/2021    9:51 AM 01/15/2021   10:50 AM 09/26/2020    8:54 AM  Fall Risk   Falls in the past year? 1 1 0 1 0  Number falls in past yr: 0 0 0 0 0  Injury with Fall? 1 1 0 0 0  Risk for fall due to : History of fall(s) History of fall(s) No Fall Risks No Fall Risks   Follow up Falls evaluation completed Falls evaluation completed Falls evaluation completed Falls evaluation completed Falls evaluation completed     Objective:   BP 130/78 (BP Location: Right Arm, Patient Position: Sitting, Cuff Size: Normal)   Pulse 68   Ht 5\' 2"  (1.575 m)   Wt 100 lb 1.3 oz (45.4 kg)   SpO2 100%   BMI 18.30 kg/m   Last Weight  Most recent update: 01/20/2022  9:48 AM    Weight  45.4 kg (100 lb 1.3 oz)             Body mass index is 18.3 kg/m.  Hearing/Vision  Gabrille did not have difficulty with hearing/understanding during the face-to-face interview Tahjae did not have difficulty with her vision during the face-to-face interview Reports that she has had a formal eye exam by an eye care professional within the past year Reports that she has not had a formal hearing evaluation within the past year  Cognitive Function:    01/20/2022    9:59 AM 01/15/2021   10:59 AM  6CIT Screen  What Year? 0 points 0 points  What month? 0 points 0 points  What time? 0 points 0 points  Count back from 20 0 points 0 points  Months in reverse 0  points 0 points  Repeat phrase 2 points 0 points  Total Score 2 points 0 points    Normal Cognitive Function Screening: Yes (Normal:0-7, Significant for Dysfunction: >8)  Immunization & Health Maintenance Record Immunization History  Administered Date(s) Administered   Fluad Quad(high Dose 65+) 03/30/2019, 05/02/2020, 04/24/2021   Influenza, High Dose Seasonal PF 07/08/2013, 05/13/2014, 04/23/2015, 05/05/2018   Influenza, Seasonal, Injecte, Preservative Fre 05/09/2010, 04/09/2012   Influenza,inj,Quad PF,6+ Mos 04/23/2016, 04/21/2017   Influenza-Unspecified 07/08/2013, 05/16/2014, 04/23/2015   Moderna SARS-COV2 Booster Vaccination 06/12/2021   Moderna Sars-Covid-2 Vaccination 08/16/2019, 09/26/2019, 06/12/2020, 12/10/2020, 06/12/2021   Pneumococcal Conjugate-13 04/23/2015   Pneumococcal Polysaccharide-23 12/20/2012   Zoster Recombinat (Shingrix) 04/24/2021, 10/22/2021    Health Maintenance  Topic Date Due   COVID-19 Vaccine (5 - Booster for Moderna series) 02/05/2022 (Originally 08/07/2021)   TETANUS/TDAP  01/21/2023 (Originally 08/27/1950)   DEXA SCAN  10/13/2028 (Originally 08/27/1998)   INFLUENZA VACCINE  03/04/2022   Pneumonia Vaccine 46+ Years old  Completed   Zoster Vaccines- Shingrix  Completed   HPV VACCINES  Aged Out       Assessment  This is a routine wellness examination for 78.  Health Maintenance: Due or Overdue There are no preventive care reminders to display for this patient.   [employer] does not need a referral for Community Assistance: Care Management:   no Social Work:    no Prescription Assistance:  no Nutrition/Diabetes Education:  no   Plan:  Personalized Goals  Goals Addressed  This Visit's Progress     Patient Stated (pt-stated)        01/20/2022 AWV Goal: Fall Prevention  Over the next year, patient will decrease their risk for falls by: Using assistive devices, such as a cane or walker, as  needed Identifying fall risks within their home and correcting them by: Removing throw rugs Adding handrails to stairs or ramps Removing clutter and keeping a clear pathway throughout the home Increasing light, especially at night Adding shower handles/bars Raising toilet seat Identifying potential personal risk factors for falls: Medication side effects Incontinence/urgency Vestibular dysfunction Hearing loss Musculoskeletal disorders Neurological disorders Orthostatic hypotension         Personalized Health Maintenance & Screening Recommendations  Td vaccine Bone densitometry screening  Lung Cancer Screening Recommended: no (Low Dose CT Chest recommended if Age 44-80 years, 30 pack-year currently smoking OR have quit w/in past 15 years) Hepatitis C Screening recommended: no HIV Screening recommended: no  Advanced Directives: Written information was not given per the patient's request.  Referrals & Orders No orders of the defined types were placed in this encounter.   Follow-up Plan Follow-up with Christen Butter, NP as planned Discusses Tetanus shot and bone density scan with PCP. Medicare wellness visit in one year.  AVS printed and given to the patient.   I have personally reviewed and noted the following in the patient's chart:   Medical and social history Use of alcohol, tobacco or illicit drugs  Current medications and supplements Functional ability and status Nutritional status Physical activity Advanced directives List of other physicians Hospitalizations, surgeries, and ER visits in previous 12 months Vitals Screenings to include cognitive, depression, and falls Referrals and appointments  In addition, I have reviewed and discussed with patient certain preventive protocols, quality metrics, and best practice recommendations. A written personalized care plan for preventive services as well as general preventive health recommendations were provided to  patient.     Modesto Charon, RN-BSN  01/20/2022

## 2022-01-20 NOTE — Patient Instructions (Addendum)
MEDICARE ANNUAL WELLNESS VISIT Health Maintenance Summary and Written Plan of Care  Alexandra Barber ,  Thank you for allowing me to perform your Medicare Annual Wellness Visit and for your ongoing commitment to your health.   Health Maintenance & Immunization History Health Maintenance  Topic Date Due   COVID-19 Vaccine (5 - Booster for Moderna series) 02/05/2022 (Originally 08/07/2021)   TETANUS/TDAP  01/21/2023 (Originally 08/27/1950)   DEXA SCAN  10/13/2028 (Originally 08/27/1998)   INFLUENZA VACCINE  03/04/2022   Pneumonia Vaccine 53+ Years old  Completed   Zoster Vaccines- Shingrix  Completed   HPV VACCINES  Aged Out   Immunization History  Administered Date(s) Administered   Fluad Quad(high Dose 65+) 03/30/2019, 05/02/2020, 04/24/2021   Influenza, High Dose Seasonal PF 07/08/2013, 05/13/2014, 04/23/2015, 05/05/2018   Influenza, Seasonal, Injecte, Preservative Fre 05/09/2010, 04/09/2012   Influenza,inj,Quad PF,6+ Mos 04/23/2016, 04/21/2017   Influenza-Unspecified 07/08/2013, 05/16/2014, 04/23/2015   Moderna SARS-COV2 Booster Vaccination 06/12/2021   Moderna Sars-Covid-2 Vaccination 08/16/2019, 09/26/2019, 06/12/2020, 12/10/2020, 06/12/2021   Pneumococcal Conjugate-13 04/23/2015   Pneumococcal Polysaccharide-23 12/20/2012   Zoster Recombinat (Shingrix) 04/24/2021, 10/22/2021    These are the patient goals that we discussed:  Goals Addressed               This Visit's Progress     Patient Stated (pt-stated)        01/20/2022 AWV Goal: Fall Prevention  Over the next year, patient will decrease their risk for falls by: Using assistive devices, such as a cane or walker, as needed Identifying fall risks within their home and correcting them by: Removing throw rugs Adding handrails to stairs or ramps Removing clutter and keeping a clear pathway throughout the home Increasing light, especially at night Adding shower handles/bars Raising toilet seat Identifying potential  personal risk factors for falls: Medication side effects Incontinence/urgency Vestibular dysfunction Hearing loss Musculoskeletal disorders Neurological disorders Orthostatic hypotension           This is a list of Health Maintenance Items that are overdue or due now: Td vaccine Bone densitometry screening    Orders/Referrals Placed Today: No orders of the defined types were placed in this encounter.  (Contact our referral department at [phone] if you have not spoken with someone about your referral appointment within the next 5 days)    Follow-up Plan Follow-up with Christen Butter, NP as planned Discusses Tetanus shot and bone density scan with PCP. Medicare wellness visit in one year.  AVS printed and given to the patient.      Bone Density Test A bone density test uses a type of X-ray to measure the amount of calcium and other minerals in a person's bones. It can measure bone density in the hip and the spine. The test is similar to having a regular X-ray. This test may also be called: Bone densitometry. Bone mineral density test. Dual-energy X-ray absorptiometry (DEXA). You may have this test to: Diagnose a condition that causes weak or thin bones (osteoporosis). Screen you for osteoporosis. Predict your risk for a broken bone (fracture). Determine how well your osteoporosis treatment is working. Tell a health care provider about: Any allergies you have. All medicines you are taking, including vitamins, herbs, eye drops, creams, and over-the-counter medicines. Any problems you or family members have had with anesthetic medicines. Any blood disorders you have. Any surgeries you have had. Any medical conditions you have. Whether you are pregnant or may be pregnant. Any medical tests you have had within the past  14 days that used contrast material. What are the risks? Generally, this is a safe test. However, it does expose you to a small amount of radiation,  which can slightly increase your cancer risk. What happens before the test? Do not take any calcium supplements within the 24 hours before your test. You will need to remove all metal jewelry, eyeglasses, removable dental appliances, and any other metal objects on your body. What happens during the test?  You will lie down on an exam table. There will be an X-ray generator below you and an imaging device above you. Other devices, such as boxes or braces, may be used to position your body properly for the scan. The machine will slowly scan your body. You will need to keep very still while the machine does the scan. The images will show up on a screen in the room. Images will be examined by a specialist after your test is finished. The procedure may vary among health care providers and hospitals. What can I expect after the test? It is up to you to get the results of your test. Ask your health care provider, or the department that is doing the test, when your results will be ready. Summary A bone density test is an imaging test that uses a type of X-ray to measure the amount of calcium and other minerals in your bones. The test may be used to diagnose or screen you for a condition that causes weak or thin bones (osteoporosis), predict your risk for a broken bone (fracture), or determine how well your osteoporosis treatment is working. Do not take any calcium supplements within 24 hours before your test. Ask your health care provider, or the department that is doing the test, when your results will be ready. This information is not intended to replace advice given to you by your health care provider. Make sure you discuss any questions you have with your health care provider. Document Revised: 04/03/2021 Document Reviewed: 01/05/2020 Elsevier Patient Education  2023 Elsevier Inc.  Health Maintenance, Female Adopting a healthy lifestyle and getting preventive care are important in promoting  health and wellness. Ask your health care provider about: The right schedule for you to have regular tests and exams. Things you can do on your own to prevent diseases and keep yourself healthy. What should I know about diet, weight, and exercise? Eat a healthy diet  Eat a diet that includes plenty of vegetables, fruits, low-fat dairy products, and lean protein. Do not eat a lot of foods that are high in solid fats, added sugars, or sodium. Maintain a healthy weight Body mass index (BMI) is used to identify weight problems. It estimates body fat based on height and weight. Your health care provider can help determine your BMI and help you achieve or maintain a healthy weight. Get regular exercise Get regular exercise. This is one of the most important things you can do for your health. Most adults should: Exercise for at least 150 minutes each week. The exercise should increase your heart rate and make you sweat (moderate-intensity exercise). Do strengthening exercises at least twice a week. This is in addition to the moderate-intensity exercise. Spend less time sitting. Even light physical activity can be beneficial. Watch cholesterol and blood lipids Have your blood tested for lipids and cholesterol at 86 years of age, then have this test every 5 years. Have your cholesterol levels checked more often if: Your lipid or cholesterol levels are high. You are older than  86 years of age. You are at high risk for heart disease. What should I know about cancer screening? Depending on your health history and family history, you may need to have cancer screening at various ages. This may include screening for: Breast cancer. Cervical cancer. Colorectal cancer. Skin cancer. Lung cancer. What should I know about heart disease, diabetes, and high blood pressure? Blood pressure and heart disease High blood pressure causes heart disease and increases the risk of stroke. This is more likely to  develop in people who have high blood pressure readings or are overweight. Have your blood pressure checked: Every 3-5 years if you are 32-78 years of age. Every year if you are 37 years old or older. Diabetes Have regular diabetes screenings. This checks your fasting blood sugar level. Have the screening done: Once every three years after age 10 if you are at a normal weight and have a low risk for diabetes. More often and at a younger age if you are overweight or have a high risk for diabetes. What should I know about preventing infection? Hepatitis B If you have a higher risk for hepatitis B, you should be screened for this virus. Talk with your health care provider to find out if you are at risk for hepatitis B infection. Hepatitis C Testing is recommended for: Everyone born from [birth year] through 1963. Anyone with known risk factors for hepatitis C. Sexually transmitted infections (STIs) Get screened for STIs, including gonorrhea and chlamydia, if: You are sexually active and are younger than 86 years of age. You are older than 86 years of age and your health care provider tells you that you are at risk for this type of infection. Your sexual activity has changed since you were last screened, and you are at increased risk for chlamydia or gonorrhea. Ask your health care provider if you are at risk. Ask your health care provider about whether you are at high risk for HIV. Your health care provider may recommend a prescription medicine to help prevent HIV infection. If you choose to take medicine to prevent HIV, you should first get tested for HIV. You should then be tested every 3 months for as long as you are taking the medicine. Pregnancy If you are about to stop having your period (premenopausal) and you may become pregnant, seek counseling before you get pregnant. Take 400 to 800 micrograms (mcg) of folic acid every day if you become pregnant. Ask for birth control (contraception) if you  want to prevent pregnancy. Osteoporosis and menopause Osteoporosis is a disease in which the bones lose minerals and strength with aging. This can result in bone fractures. If you are 23 years old or older, or if you are at risk for osteoporosis and fractures, ask your health care provider if you should: Be screened for bone loss. Take a calcium or vitamin D supplement to lower your risk of fractures. Be given hormone replacement therapy (HRT) to treat symptoms of menopause. Follow these instructions at home: Alcohol use Do not drink alcohol if: Your health care provider tells you not to drink. You are pregnant, may be pregnant, or are planning to become pregnant. If you drink alcohol: Limit how much you have to: 0-1 drink a day. Know how much alcohol is in your drink. In the [place] one drink equals one 12 oz bottle of beer (355 mL), one 5 oz glass of wine (148 mL), or one 1 oz glass of hard liquor (44 mL). Lifestyle Do not  use any products that contain nicotine or tobacco. These products include cigarettes, chewing tobacco, and vaping devices, such as e-cigarettes. If you need help quitting, ask your health care provider. Do not use street drugs. Do not share needles. Ask your health care provider for help if you need support or information about quitting drugs. General instructions Schedule regular health, dental, and eye exams. Stay current with your vaccines. Tell your health care provider if: You often feel depressed. You have ever been abused or do not feel safe at home. Summary Adopting a healthy lifestyle and getting preventive care are important in promoting health and wellness. Follow your health care provider's instructions about healthy diet, exercising, and getting tested or screened for diseases. Follow your health care provider's instructions on monitoring your cholesterol and blood pressure. This information is not intended to replace advice given to you by your health  care provider. Make sure you discuss any questions you have with your health care provider. Document Revised: 12/10/2020 Document Reviewed: 12/10/2020 Elsevier Patient Education  2023 [employer].

## 2022-02-19 NOTE — Progress Notes (Signed)
Established Patient Office Visit  Subjective   Patient ID: Alexandra Barber, female   DOB: [birth year] Age: 88 y.o. MRN: [id number]   Chief Complaint  Patient presents with   right knee problems    HPI Pleasant 88 year old female presenting today for evaluation of knee pain that has been bothersome since a fall she had in May.  Notes that her knee has gotten exquisitely painful and swollen.  She has been trying to continue dancing however she has been unable to do so over the last several days.  Notes about 5 days ago, her symptoms drastically worsened and now she is having to ambulate with a walker.  Having pain with bending the knee even slightly.  Notes that she was at an event the other day and had to keep her leg stretched out in a straight position for about 3 hours.  After that, her pain flared tremendously.  She has been taking over-the-counter naproxen which is slightly helpful.  She has been using an Ace wrap for compression as well as icing the knee regularly.  No significant improvement in her symptoms.  Having trouble sleeping at night due to waking with pain with any kind of movement.  She was seen for this shortly after the fall however her symptoms were fairly mild.  Has been doing regular home exercises as instructed.   Objective:    Vitals:   02/20/22 1044  BP: 129/76  Pulse: 88  Resp: 20  Height: 5\' 2"  (1.575 m)  Weight: 96 lb 0.6 oz (43.6 kg)  SpO2: 98%  BMI (Calculated): 17.56    Physical Exam Vitals and nursing note reviewed.  Constitutional:      General: She is not in acute distress.    Appearance: Normal appearance. She is not ill-appearing.  HENT:     Head: Normocephalic and atraumatic.  Cardiovascular:     Rate and Rhythm: Normal rate and regular rhythm.     Pulses: Normal pulses.     Heart sounds: Normal heart sounds.  Pulmonary:     Effort: Pulmonary effort is normal. No respiratory distress.     Breath sounds: Normal breath sounds. No wheezing,  rhonchi or rales.  Musculoskeletal:        General: Swelling (Right knee) and tenderness (Right knee) present.     Right knee: Swelling and effusion present. Decreased range of motion. Tenderness present.  Skin:    General: Skin is warm and dry.  Neurological:     Mental Status: She is alert and oriented to person, place, and time.  Psychiatric:        Mood and Affect: Mood normal.        Behavior: Behavior normal.        Thought Content: Thought content normal.        Judgment: Judgment normal.   No results found for this or any previous visit (from the past 24 hour(s)).     The ASCVD Risk score (Arnett DK, et al., 2019) failed to calculate for the following reasons:   The 2019 ASCVD risk score is only valid for ages 61 to 80   Assessment & Plan:   1. Chronic pain of right knee Discontinue naproxen.  Start Celebrex 100 mg twice daily.  We will use tramadol 50 mg 3 times daily as needed for severe pain.  Advised to monitor use of this and only use for severe pain.  This may cause drowsiness so would best be used at night.  She  has been compliant with conservative measures and has completed greater than 6 weeks of anti-inflammatories and home physical therapy.  Proceed with MRI for further evaluation.  Knee brace applied in office today for support but advised to continue using the joint and doing range of motion exercises throughout the day.  Handicap placard form completed per patient request.  After evaluation, Dr. Benjamin Stain was consulted for possible bedside procedure of joint aspiration for immediate relief of pain and for other recommendations/management. - celecoxib (CELEBREX) 100 MG capsule; Take 1 capsule (100 mg total) by mouth 2 (two) times daily.  Dispense: 60 capsule; Refill: 1 - MR Knee Right Wo Contrast; Future - traMADol (ULTRAM) 50 MG tablet; Take 1 tablet (50 mg total) by mouth every 8 (eight) hours as needed for up to 5 days.  Dispense: 15 tablet; Refill: 0  Return  for Right knee pain follow-up pending MRI/lab results.  ___________________________________________ Thayer Ohm, DNP, APRN, FNP-BC Primary Care and Sports Medicine [facility] [place]

## 2022-02-20 ENCOUNTER — Encounter: Payer: Self-pay | Admitting: Medical-Surgical

## 2022-02-20 ENCOUNTER — Ambulatory Visit (INDEPENDENT_AMBULATORY_CARE_PROVIDER_SITE_OTHER): Payer: Medicare Other | Admitting: Medical-Surgical

## 2022-02-20 ENCOUNTER — Ambulatory Visit (INDEPENDENT_AMBULATORY_CARE_PROVIDER_SITE_OTHER): Payer: Medicare Other

## 2022-02-20 VITALS — BP 129/76 | HR 88 | Resp 20 | Ht 62.0 in | Wt 96.0 lb

## 2022-02-20 DIAGNOSIS — M1711 Unilateral primary osteoarthritis, right knee: Secondary | ICD-10-CM | POA: Insufficient documentation

## 2022-02-20 DIAGNOSIS — M25561 Pain in right knee: Secondary | ICD-10-CM

## 2022-02-20 DIAGNOSIS — G8929 Other chronic pain: Secondary | ICD-10-CM | POA: Diagnosis not present

## 2022-02-20 HISTORY — DX: Unilateral primary osteoarthritis, right knee: M17.11

## 2022-02-20 MED ORDER — TRAMADOL HCL 50 MG PO TABS: 50.0000 mg | ORAL_TABLET | Freq: Three times a day (TID) | ORAL | 0 refills | Status: AC | PRN

## 2022-02-20 MED ORDER — CELECOXIB 100 MG PO CAPS: 100.0000 mg | ORAL_CAPSULE | Freq: Two times a day (BID) | ORAL | 1 refills | Status: DC

## 2022-02-20 NOTE — Progress Notes (Signed)
    Procedures performed today:    Procedure: Real-time Ultrasound Guided aspiration/injection of right knee Device: Samsung HS60  Verbal informed consent obtained.  Time-out conducted.  Noted no overlying erythema, induration, or other signs of local infection.  Skin prepped in a sterile fashion.  Local anesthesia: Topical Ethyl chloride.  With sterile technique and under real time ultrasound guidance: Noted large effusion, using 18-gauge needle aspirated approximately 37 mL of cloudy straw-colored fluid, syringe switched and 1 cc Kenalog 40, 2 cc lidocaine, 2 cc bupivacaine injected easily Completed without difficulty  Advised to call if fevers/chills, erythema, induration, drainage, or persistent bleeding.  Images permanently stored and available for review in PACS.  Impression: Technically successful ultrasound guided aspiration/injection.  Independent interpretation of notes and tests performed by another provider:   None.  Brief History, Exam, Impression, and Recommendations:    Primary osteoarthritis of right knee Pleasant 88 year old female, increasing swelling pain right knee, moderate chronicity. Today we did an aspiration and injection, fluid was very cloudy, we will send this off for cell counts, crystal analysis and cultures. Adding a reaction knee brace. Follow this up with Christen Butter, DNP.    ____________________________________________ Ihor Austin. Benjamin Stain, M.D., ABFM., CAQSM., AME. Primary Care and Sports Medicine [facility]  Adjunct Professor of Family Medicine  [employer]  Restaurant manager, fast food

## 2022-02-20 NOTE — Assessment & Plan Note (Signed)
Pleasant 88 year old female, increasing swelling pain right knee, moderate chronicity. Today we did an aspiration and injection, fluid was very cloudy, we will send this off for cell counts, crystal analysis and cultures. Adding a reaction knee brace. Follow this up with Christen Butter, DNP.

## 2022-02-21 LAB — TIQ-NTM

## 2022-02-22 ENCOUNTER — Ambulatory Visit (INDEPENDENT_AMBULATORY_CARE_PROVIDER_SITE_OTHER): Payer: Medicare Other

## 2022-02-22 DIAGNOSIS — M25461 Effusion, right knee: Secondary | ICD-10-CM | POA: Diagnosis not present

## 2022-02-22 DIAGNOSIS — M25561 Pain in right knee: Secondary | ICD-10-CM

## 2022-02-22 DIAGNOSIS — G8929 Other chronic pain: Secondary | ICD-10-CM

## 2022-02-22 DIAGNOSIS — M1711 Unilateral primary osteoarthritis, right knee: Secondary | ICD-10-CM | POA: Diagnosis not present

## 2022-02-22 DIAGNOSIS — M7121 Synovial cyst of popliteal space [Baker], right knee: Secondary | ICD-10-CM | POA: Diagnosis not present

## 2022-02-22 DIAGNOSIS — R600 Localized edema: Secondary | ICD-10-CM | POA: Diagnosis not present

## 2022-03-27 LAB — ANAEROBIC AND AEROBIC CULTURE
AER RESULT:: NO GROWTH
MICRO NUMBER:: 13685133
MICRO NUMBER:: 13685134
SPECIMEN QUALITY:: ADEQUATE
SPECIMEN QUALITY:: ADEQUATE

## 2022-03-27 LAB — SYNOVIAL FLUID, CRYSTAL

## 2022-05-01 ENCOUNTER — Encounter: Payer: Self-pay | Admitting: Medical-Surgical

## 2022-05-01 ENCOUNTER — Ambulatory Visit: Payer: Medicare Other | Admitting: Medical-Surgical

## 2022-05-01 VITALS — BP 147/80 | HR 65 | Resp 20 | Ht 62.0 in | Wt 98.1 lb

## 2022-05-01 DIAGNOSIS — G8929 Other chronic pain: Secondary | ICD-10-CM

## 2022-05-01 DIAGNOSIS — M25561 Pain in right knee: Secondary | ICD-10-CM | POA: Diagnosis not present

## 2022-05-01 DIAGNOSIS — I10 Essential (primary) hypertension: Secondary | ICD-10-CM | POA: Diagnosis not present

## 2022-05-01 DIAGNOSIS — E782 Mixed hyperlipidemia: Secondary | ICD-10-CM

## 2022-05-01 DIAGNOSIS — Z23 Encounter for immunization: Secondary | ICD-10-CM

## 2022-05-01 MED ORDER — CELECOXIB 100 MG PO CAPS: 100.0000 mg | ORAL_CAPSULE | Freq: Two times a day (BID) | ORAL | 1 refills | Status: DC

## 2022-05-01 NOTE — Progress Notes (Signed)
Established Patient Office Visit  Subjective   Patient ID: Alexandra Barber, female   DOB: 03/14/1932 Age: 88 y.o. MRN: [id number]   Chief Complaint  Patient presents with   Hypertension    HPI Very pleasant 88 year old female presenting today for the following:  Hypertension: taking Lisinopril 2.5mg  daily, tolerating well without side effects. Following a low sodium diet. Not regularly checking BP at home but does occasionally and reports readings in the 120s/70s. Stays active and still dancing when her knee is not hurting too bad.   Hyperlipidemia: Taking pravastatin 20 mg daily, tolerating well without side effects.  Also taking omega-3 fatty acids and reports that she started herself on these years ago.  Following a low-fat diet.  Right knee: Known significant right knee osteoarthritis.  Had a steroid injection a couple of months ago which was very helpful however over the last week, she has noted an increase in pain as well as swelling in the right knee.  She stays very active as above and the pain in her knee is starting to affect her ability to dance.  She was taking tramadol for a short period of time which was somewhat helpful.  Also her prescription for Celebrex and reports that she took her last one of those several days ago.  She does have a brace at home for her knee however she does not use this regularly.  Tends to prefer icing which she reports is somewhat helpful temporarily.   Objective:    Vitals:   05/01/22 0925 05/01/22 1011  BP: (!) 144/76 (!) 147/80  Pulse: 67 65  Resp: 20   Height: 5\' 2"  (1.575 m)   Weight: 98 lb 1.6 oz (44.5 kg)   SpO2: 98%   BMI (Calculated): 17.94     Physical Exam Vitals and nursing note reviewed.  Constitutional:      General: She is not in acute distress.    Appearance: Normal appearance. She is not ill-appearing.  HENT:     Head: Normocephalic and atraumatic.  Cardiovascular:     Rate and Rhythm: Normal rate and regular rhythm.      Pulses: Normal pulses.     Heart sounds: Normal heart sounds.  Pulmonary:     Effort: Pulmonary effort is normal. No respiratory distress.     Breath sounds: Normal breath sounds. No wheezing, rhonchi or rales.  Musculoskeletal:     Right knee: Swelling present. Decreased range of motion.  Skin:    General: Skin is warm and dry.  Neurological:     Mental Status: She is alert and oriented to person, place, and time.  Psychiatric:        Mood and Affect: Mood normal.        Behavior: Behavior normal.        Thought Content: Thought content normal.        Judgment: Judgment normal.   No results found for this or any previous visit (from the past 24 hour(s)).     The ASCVD Risk score (Arnett DK, et al., 2019) failed to calculate for the following reasons:   The 2019 ASCVD risk score is only valid for ages 60 to 8   Assessment & Plan:   1. Primary hypertension Checking labs as below.  Blood pressure is slightly elevated today on initial reading and again on recheck.  This is likely due to her current pain level.  Continue lisinopril 2.5 mg daily.  Recommend measuring blood pressure at least 2-3 times  weekly at home with goal of 130/80 or less.  If consistently higher, we will need to make medication adjustments. - COMPLETE METABOLIC PANEL WITH GFR - Lipid panel - CBC  2. Mixed hyperlipidemia Checking labs as below.  Continue pravastatin as prescribed. - COMPLETE METABOLIC PANEL WITH GFR - Lipid panel  3. Chronic pain of right knee Restart Celebrex 100 mg twice daily.  Discussed using a knee sleeve for support and compression as well as continued icing as needed. - celecoxib (CELEBREX) 100 MG capsule; Take 1 capsule (100 mg total) by mouth 2 (two) times daily.  Dispense: 60 capsule; Refill: 1  4. Need for influenza vaccination Flu vaccine given in office. - Flu Vaccine QUAD High Dose(Fluad)   Return in about 6 months (around 10/30/2022) for HLD/HTN follow  up.  ___________________________________________ Clearnce Sorrel, DNP, APRN, FNP-BC Primary Care and [place]

## 2022-05-02 LAB — CBC
HCT: 32.6 % — ABNORMAL LOW (ref 35.0–45.0)
Hemoglobin: 10.4 g/dL — ABNORMAL LOW (ref 11.7–15.5)
MCH: 27 pg (ref 27.0–33.0)
MCHC: 31.9 g/dL — ABNORMAL LOW (ref 32.0–36.0)
MCV: 84.7 fL (ref 80.0–100.0)
MPV: 10.9 fL (ref 7.5–12.5)
Platelets: 256 10*3/uL (ref 140–400)
RBC: 3.85 10*6/uL (ref 3.80–5.10)
RDW: 13.9 % (ref 11.0–15.0)
WBC: 5.7 10*3/uL (ref 3.8–10.8)

## 2022-05-02 LAB — COMPLETE METABOLIC PANEL WITH GFR
AG Ratio: 1.7 (calc) (ref 1.0–2.5)
ALT: 9 U/L (ref 6–29)
AST: 19 U/L (ref 10–35)
Albumin: 4.5 g/dL (ref 3.6–5.1)
Alkaline phosphatase (APISO): 36 U/L — ABNORMAL LOW (ref 37–153)
BUN: 12 mg/dL (ref 7–25)
CO2: 28 mmol/L (ref 20–32)
Calcium: 9.9 mg/dL (ref 8.6–10.4)
Chloride: 104 mmol/L (ref 98–110)
Creat: 0.63 mg/dL (ref 0.60–0.95)
Globulin: 2.6 g/dL (calc) (ref 1.9–3.7)
Glucose, Bld: 89 mg/dL (ref 65–99)
Potassium: 4.2 mmol/L (ref 3.5–5.3)
Sodium: 139 mmol/L (ref 135–146)
Total Bilirubin: 0.8 mg/dL (ref 0.2–1.2)
Total Protein: 7.1 g/dL (ref 6.1–8.1)
eGFR: 85 mL/min/{1.73_m2} (ref >=60)

## 2022-05-02 LAB — LIPID PANEL
Cholesterol: 226 mg/dL — ABNORMAL HIGH (ref <200)
HDL: 80 mg/dL (ref >=50)
LDL Cholesterol (Calc): 130 mg/dL (calc) — ABNORMAL HIGH
Non-HDL Cholesterol (Calc): 146 mg/dL (calc) — ABNORMAL HIGH (ref <130)
Total CHOL/HDL Ratio: 2.8 (calc) (ref <5.0)
Triglycerides: 65 mg/dL (ref <150)

## 2022-05-08 IMAGING — DX DG KNEE COMPLETE 4+V*R*
4 series · 4 of 4 positions shown · non-contrast
Comparison: None.

CLINICAL DATA: Right posterior and medial knee pain after fall 8
days ago.

EXAM:
RIGHT KNEE - COMPLETE 4+ VIEW

[knee ap]
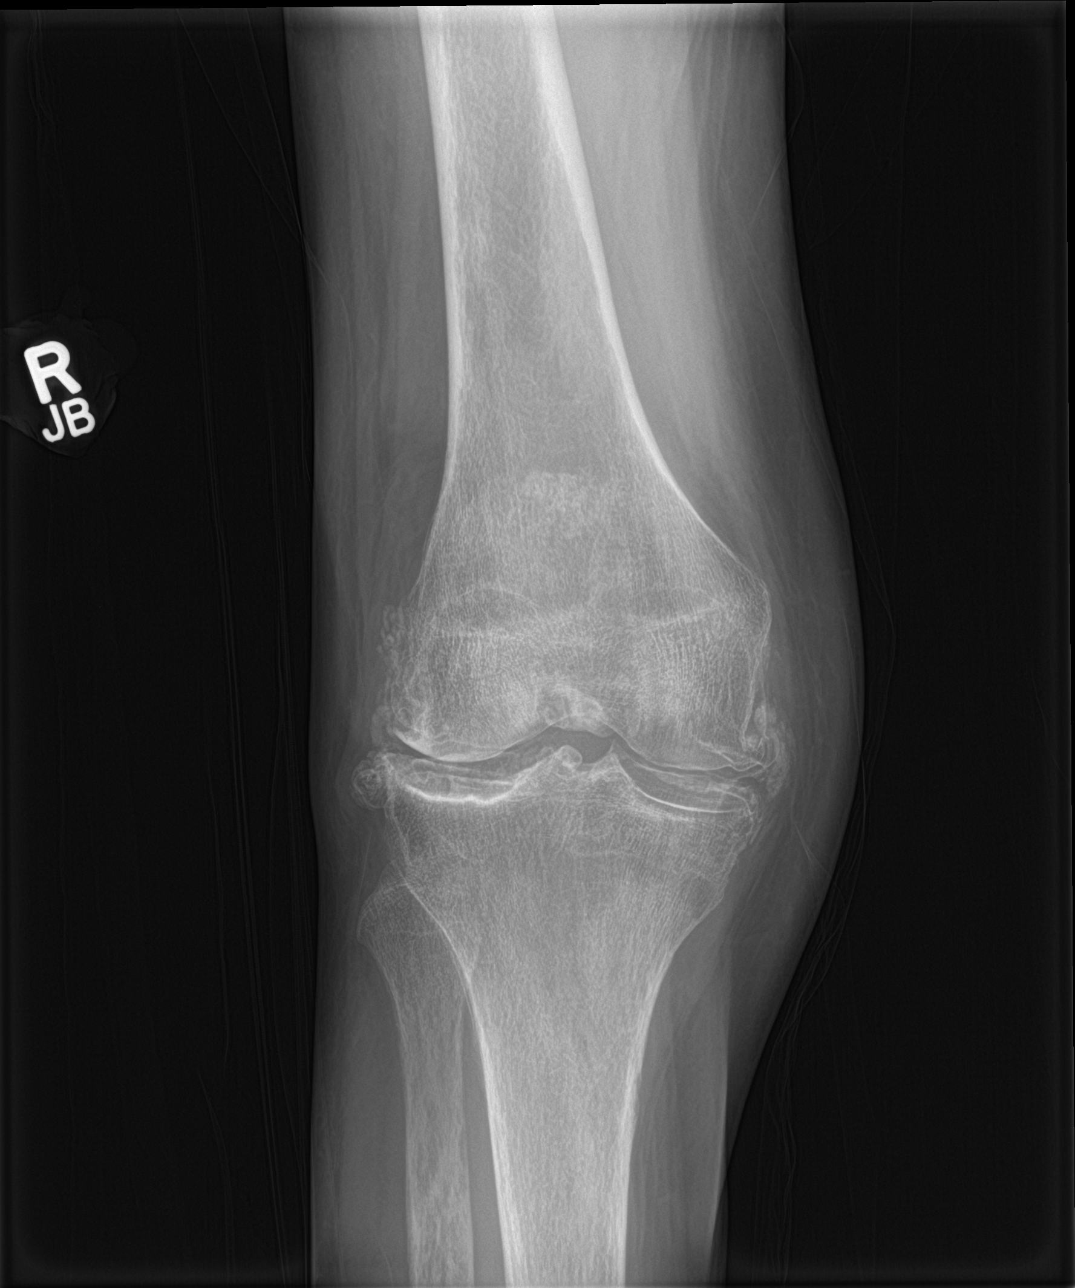

[knee lat]
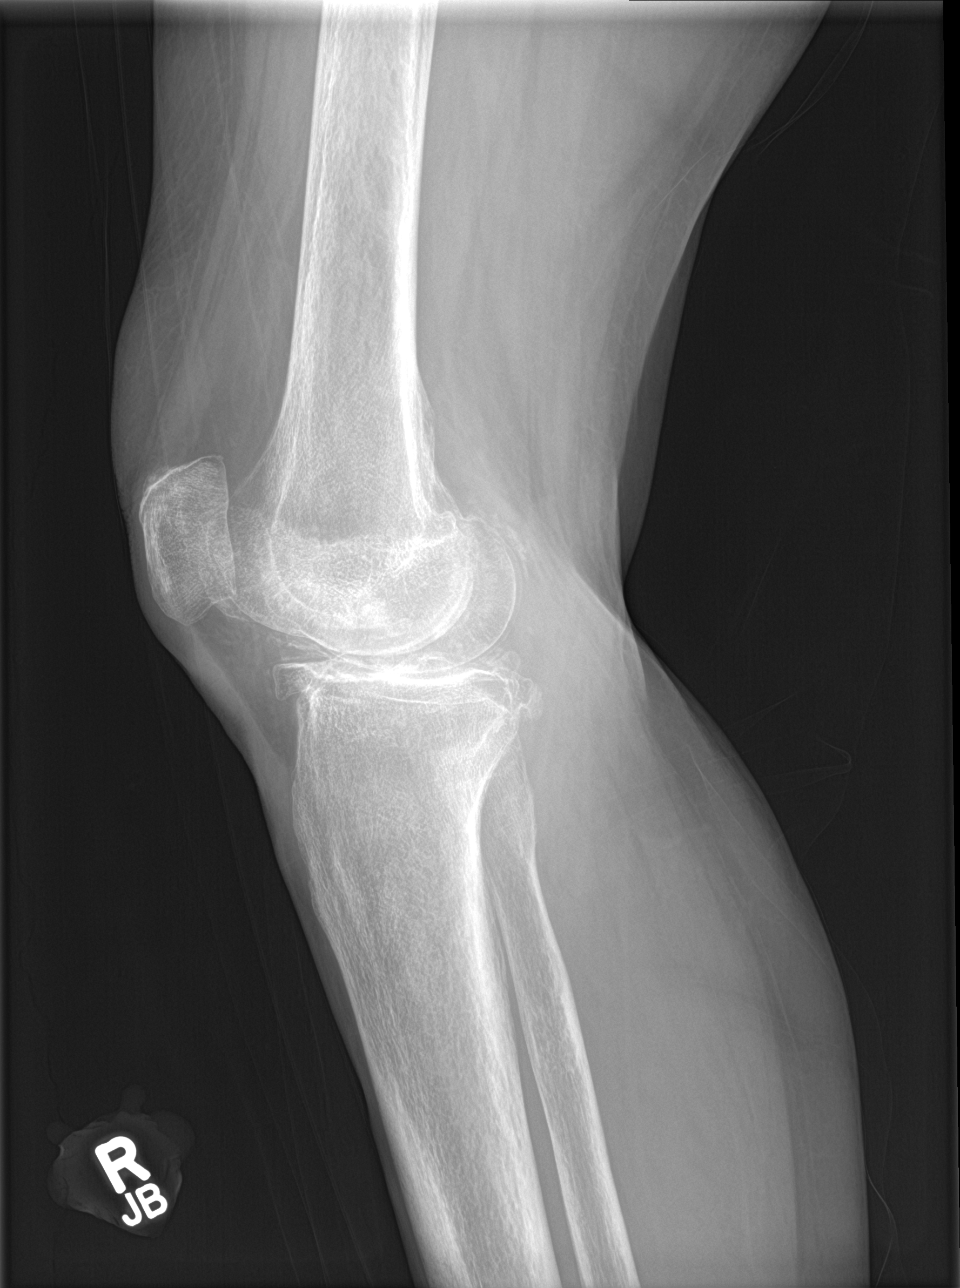

[knee obl (1 of 2)]
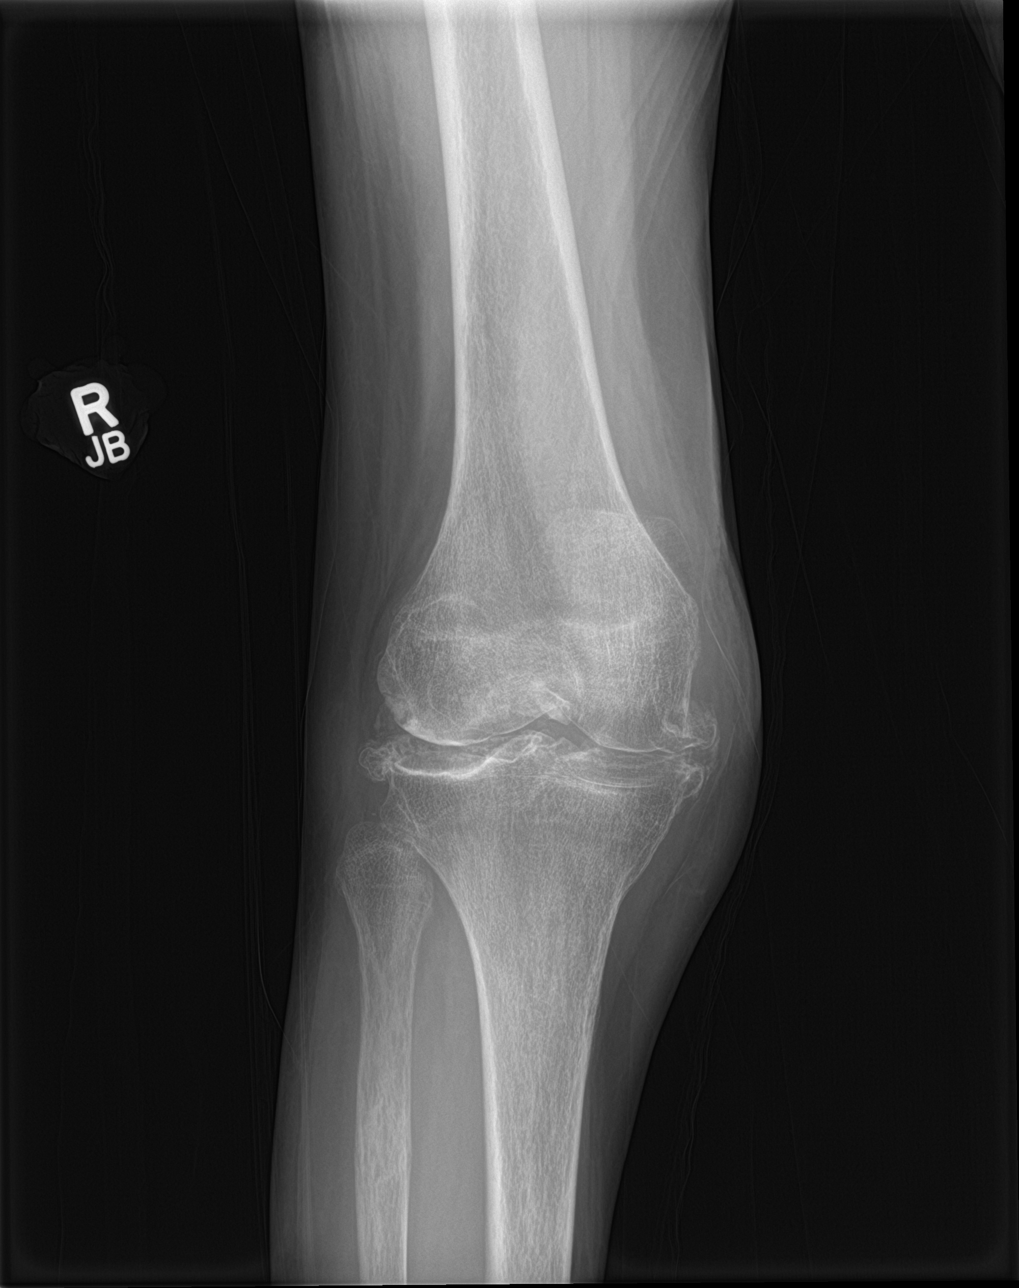

[knee obl (2 of 2)]
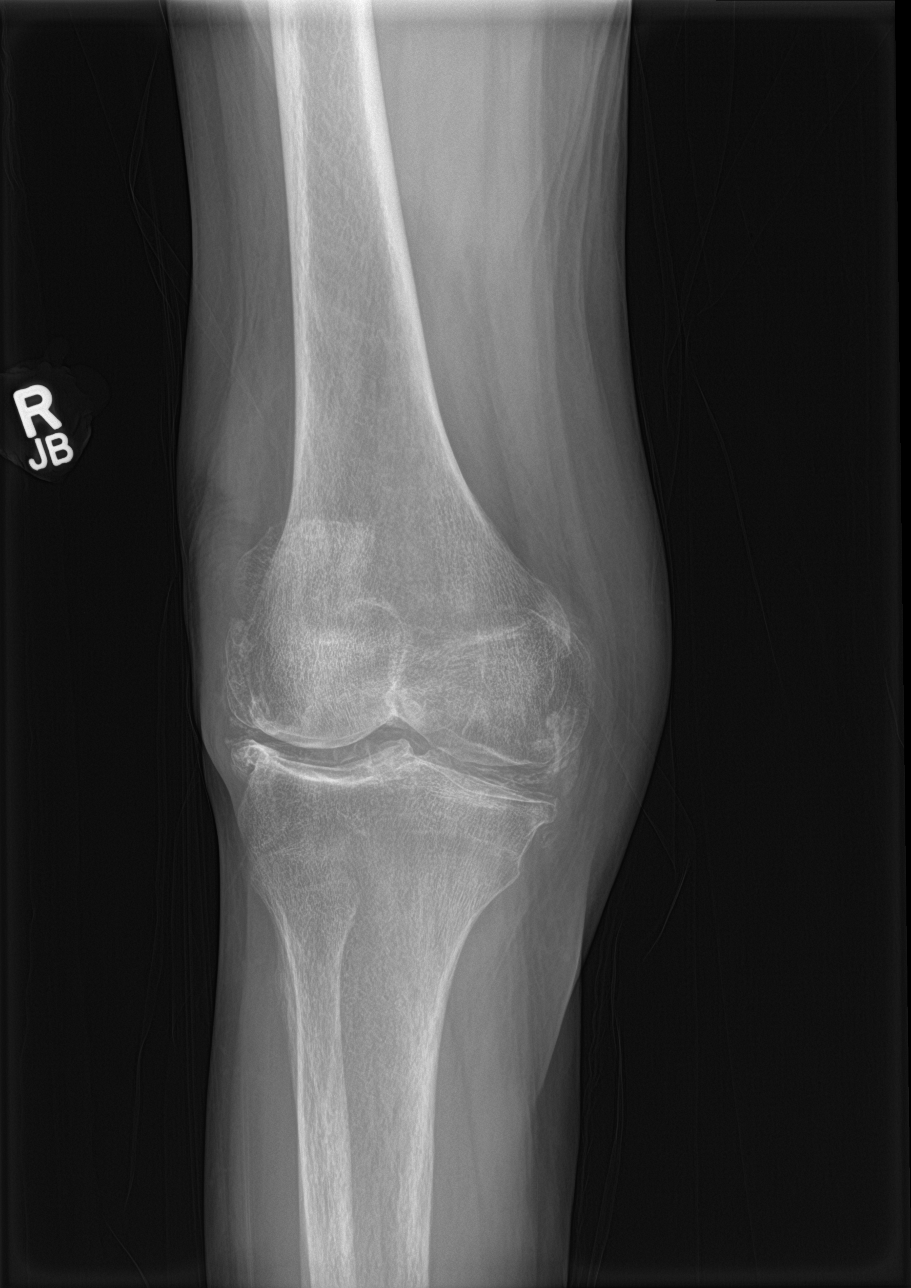

[4 of 4 positions shown; findings below may reference images not displayed]

FINDINGS: No evidence of fracture, dislocation, or significant joint effusion.
Moderate tricompartment degenerative change. Soft tissues are
unremarkable.
IMPRESSION: No acute abnormality in the right knee. Moderate tricompartment
degenerative change.

## 2022-07-01 ENCOUNTER — Other Ambulatory Visit: Payer: Self-pay | Admitting: Medical-Surgical

## 2022-07-01 DIAGNOSIS — Z1231 Encounter for screening mammogram for malignant neoplasm of breast: Secondary | ICD-10-CM

## 2022-07-16 ENCOUNTER — Ambulatory Visit (INDEPENDENT_AMBULATORY_CARE_PROVIDER_SITE_OTHER): Payer: Medicare Other | Admitting: Family Medicine

## 2022-07-16 VITALS — Temp 98.6°F

## 2022-07-16 DIAGNOSIS — Z23 Encounter for immunization: Secondary | ICD-10-CM

## 2022-07-16 NOTE — Progress Notes (Signed)
Medical screening examination/treatment was performed by qualified clinical staff member and as supervising physician I was immediately available for consultation/collaboration. I have reviewed documentation and agree with assessment and plan.  Lacrisha Bielicki, DO  

## 2022-08-14 DIAGNOSIS — H2513 Age-related nuclear cataract, bilateral: Secondary | ICD-10-CM | POA: Diagnosis not present

## 2022-08-14 DIAGNOSIS — H52223 Regular astigmatism, bilateral: Secondary | ICD-10-CM | POA: Diagnosis not present

## 2022-08-14 DIAGNOSIS — H5203 Hypermetropia, bilateral: Secondary | ICD-10-CM | POA: Diagnosis not present

## 2022-08-14 DIAGNOSIS — H04123 Dry eye syndrome of bilateral lacrimal glands: Secondary | ICD-10-CM | POA: Diagnosis not present

## 2022-08-28 ENCOUNTER — Ambulatory Visit
Admission: RE | Admit: 2022-08-28 | Discharge: 2022-08-28 | Disposition: A | Discharge status: Home / Self Care | Payer: Medicare Other | Source: Ambulatory Visit | Attending: Medical-Surgical | Admitting: Medical-Surgical

## 2022-08-28 DIAGNOSIS — Z1231 Encounter for screening mammogram for malignant neoplasm of breast: Secondary | ICD-10-CM

## 2022-09-01 ENCOUNTER — Other Ambulatory Visit: Payer: Self-pay | Admitting: Medical-Surgical

## 2022-09-01 DIAGNOSIS — R928 Other abnormal and inconclusive findings on diagnostic imaging of breast: Secondary | ICD-10-CM

## 2022-09-05 ENCOUNTER — Ambulatory Visit
Admission: RE | Admit: 2022-09-05 | Discharge: 2022-09-05 | Disposition: A | Discharge status: Home / Self Care | Payer: Medicare Other | Source: Ambulatory Visit | Attending: Medical-Surgical | Admitting: Medical-Surgical

## 2022-09-05 ENCOUNTER — Ambulatory Visit: Admission: RE | Admit: 2022-09-05 | Payer: Medicare Other | Source: Ambulatory Visit

## 2022-09-05 DIAGNOSIS — R922 Inconclusive mammogram: Secondary | ICD-10-CM | POA: Diagnosis not present

## 2022-09-05 DIAGNOSIS — R928 Other abnormal and inconclusive findings on diagnostic imaging of breast: Secondary | ICD-10-CM

## 2022-10-30 ENCOUNTER — Encounter: Payer: Self-pay | Admitting: Medical-Surgical

## 2022-10-30 ENCOUNTER — Ambulatory Visit: Payer: Medicare Other | Admitting: Medical-Surgical

## 2022-10-30 VITALS — BP 120/67 | HR 73 | Resp 20 | Ht 62.0 in | Wt 97.7 lb

## 2022-10-30 DIAGNOSIS — I10 Essential (primary) hypertension: Secondary | ICD-10-CM | POA: Diagnosis not present

## 2022-10-30 DIAGNOSIS — E782 Mixed hyperlipidemia: Secondary | ICD-10-CM

## 2022-10-30 MED ORDER — LISINOPRIL 2.5 MG PO TABS: 2.5000 mg | ORAL_TABLET | Freq: Every day | ORAL | 3 refills | Status: DC

## 2022-10-30 MED ORDER — PRAVASTATIN SODIUM 20 MG PO TABS: 20.0000 mg | ORAL_TABLET | Freq: Every day | ORAL | 3 refills | Status: DC

## 2022-10-30 NOTE — Progress Notes (Signed)
        Established patient visit  History, exam, impression, and plan:  Hypertension Taking lisinopril 2.5 mg daily, tolerating well without side effects.  Not regularly monitoring blood pressure at home.  Follows a low-sodium diet and stays very active.  Continues to dance for church performances.  Heart rate 73, normal S1/S2, regular rhythm.  No peripheral edema.  Denies chest pain, shortness of breath, palpitations, headaches, and vision changes.  Blood pressure at goal.  Continue lisinopril 2.5 mg daily.  Hyperlipidemia Taking pravastatin 20 mg daily, tolerating well without side effects.  Following a low-fat heart healthy diet.  Recent lipids remain slightly elevated however risk versus benefits of increasing statin medication reviewed.  Plan to continue pravastatin 20 mg daily.  Continue to work on reducing intake of saturated and trans fats.   Procedures performed this visit: None.  Return in about 6 months (around 05/02/2023) for chronic disease follow up.  __________________________________ Clearnce Sorrel, DNP, APRN, FNP-BC Primary Care and [place]

## 2022-10-30 NOTE — Assessment & Plan Note (Signed)
Taking pravastatin 20 mg daily, tolerating well without side effects.  Following a low-fat heart healthy diet.  Recent lipids remain slightly elevated however risk versus benefits of increasing statin medication reviewed.  Plan to continue pravastatin 20 mg daily.  Continue to work on reducing intake of saturated and trans fats.

## 2022-10-30 NOTE — Assessment & Plan Note (Signed)
Taking lisinopril 2.5 mg daily, tolerating well without side effects.  Not regularly monitoring blood pressure at home.  Follows a low-sodium diet and stays very active.  Continues to dance for church performances.  Heart rate 73, normal S1/S2, regular rhythm.  No peripheral edema.  Denies chest pain, shortness of breath, palpitations, headaches, and vision changes.  Blood pressure at goal.  Continue lisinopril 2.5 mg daily.

## 2023-01-26 ENCOUNTER — Ambulatory Visit (INDEPENDENT_AMBULATORY_CARE_PROVIDER_SITE_OTHER): Payer: Medicare Other | Admitting: Medical-Surgical

## 2023-01-26 VITALS — BP 129/66 | HR 76 | Ht 62.0 in | Wt 96.0 lb

## 2023-01-26 DIAGNOSIS — Z0000 Encounter for general adult medical examination without abnormal findings: Secondary | ICD-10-CM | POA: Diagnosis not present

## 2023-01-26 NOTE — Patient Instructions (Addendum)
MEDICARE ANNUAL WELLNESS VISIT Health Maintenance Summary and Written Plan of Care  Alexandra Barber ,  Thank you for allowing me to perform your Medicare Annual Wellness Visit and for your ongoing commitment to your health.   Health Maintenance & Immunization History Health Maintenance  Topic Date Due   DTaP/Tdap/Td (1 - Tdap) Never done   COVID-19 Vaccine (6 - 2023-24 season) 02/11/2023 (Originally 09/10/2022)   DEXA SCAN  10/13/2028 (Originally 08/27/1998)   INFLUENZA VACCINE  03/05/2023   Medicare Annual Wellness (AWV)  01/26/2024   Pneumonia Vaccine 4+ Years old  Completed   Zoster Vaccines- Shingrix  Completed   HPV VACCINES  Aged Out   Immunization History  Administered Date(s) Administered   COVID-19, mRNA, vaccine(Comirnaty)12 years and older 07/16/2022   Fluad Quad(high Dose 65+) 03/30/2019, 05/02/2020, 04/24/2021, 05/01/2022   Influenza, High Dose Seasonal PF 07/08/2013, 05/13/2014, 04/23/2015, 05/05/2018   Influenza, Seasonal, Injecte, Preservative Fre 05/09/2010, 04/09/2012   Influenza,inj,Quad PF,6+ Mos 04/23/2016, 04/21/2017   Influenza-Unspecified 07/08/2013, 05/16/2014, 04/23/2015   Moderna SARS-COV2 Booster Vaccination 06/12/2021   Moderna Sars-Covid-2 Vaccination 08/16/2019, 09/26/2019, 06/12/2020, 12/10/2020, 06/12/2021   Pneumococcal Conjugate-13 04/23/2015   Pneumococcal Polysaccharide-23 12/20/2012   Zoster Recombinat (Shingrix) 04/24/2021, 10/22/2021    These are the patient goals that we discussed:  Goals Addressed               This Visit's Progress     Patient Stated (pt-stated)        Patient stated that she would like to get her knee to start feeling better.          This is a list of Health Maintenance Items that are overdue or due now: Health Maintenance Due  Topic Date Due   DTaP/Tdap/Td (1 - Tdap) Never done   Td vaccine Dexa scan  Patient declined the td vaccine and bone density scan.  Orders/Referrals Placed Today: No orders  of the defined types were placed in this encounter.  (Contact our referral department at [phone] if you have not spoken with someone about your referral appointment within the next 5 days)    Follow-up Plan Follow-up with Christen Butter, NP as planned Please let us know when you are ready for the bone density scan. Medicare wellness visit in one year.  AVS printed and given to the patient.      Health Maintenance, Female Adopting a healthy lifestyle and getting preventive care are important in promoting health and wellness. Ask your health care provider about: The right schedule for you to have regular tests and exams. Things you can do on your own to prevent diseases and keep yourself healthy. What should I know about diet, weight, and exercise? Eat a healthy diet  Eat a diet that includes plenty of vegetables, fruits, low-fat dairy products, and lean protein. Do not eat a lot of foods that are high in solid fats, added sugars, or sodium. Maintain a healthy weight Body mass index (BMI) is used to identify weight problems. It estimates body fat based on height and weight. Your health care provider can help determine your BMI and help you achieve or maintain a healthy weight. Get regular exercise Get regular exercise. This is one of the most important things you can do for your health. Most adults should: Exercise for at least 150 minutes each week. The exercise should increase your heart rate and make you sweat (moderate-intensity exercise). Do strengthening exercises at least twice a week. This is in addition to the moderate-intensity exercise.  Spend less time sitting. Even light physical activity can be beneficial. Watch cholesterol and blood lipids Have your blood tested for lipids and cholesterol at 87 years of age, then have this test every 5 years. Have your cholesterol levels checked more often if: Your lipid or cholesterol levels are high. You are older than 87 years of  age. You are at high risk for heart disease. What should I know about cancer screening? Depending on your health history and family history, you may need to have cancer screening at various ages. This may include screening for: Breast cancer. Cervical cancer. Colorectal cancer. Skin cancer. Lung cancer. What should I know about heart disease, diabetes, and high blood pressure? Blood pressure and heart disease High blood pressure causes heart disease and increases the risk of stroke. This is more likely to develop in people who have high blood pressure readings or are overweight. Have your blood pressure checked: Every 3-5 years if you are 66-67 years of age. Every year if you are 5 years old or older. Diabetes Have regular diabetes screenings. This checks your fasting blood sugar level. Have the screening done: Once every three years after age 18 if you are at a normal weight and have a low risk for diabetes. More often and at a younger age if you are overweight or have a high risk for diabetes. What should I know about preventing infection? Hepatitis B If you have a higher risk for hepatitis B, you should be screened for this virus. Talk with your health care provider to find out if you are at risk for hepatitis B infection. Hepatitis C Testing is recommended for: Everyone born from [birth year] through 1963. Anyone with known risk factors for hepatitis C. Sexually transmitted infections (STIs) Get screened for STIs, including gonorrhea and chlamydia, if: You are sexually active and are younger than 87 years of age. You are older than 87 years of age and your health care provider tells you that you are at risk for this type of infection. Your sexual activity has changed since you were last screened, and you are at increased risk for chlamydia or gonorrhea. Ask your health care provider if you are at risk. Ask your health care provider about whether you are at high risk for HIV. Your health  care provider may recommend a prescription medicine to help prevent HIV infection. If you choose to take medicine to prevent HIV, you should first get tested for HIV. You should then be tested every 3 months for as long as you are taking the medicine. Pregnancy If you are about to stop having your period (premenopausal) and you may become pregnant, seek counseling before you get pregnant. Take 400 to 800 micrograms (mcg) of folic acid every day if you become pregnant. Ask for birth control (contraception) if you want to prevent pregnancy. Osteoporosis and menopause Osteoporosis is a disease in which the bones lose minerals and strength with aging. This can result in bone fractures. If you are 29 years old or older, or if you are at risk for osteoporosis and fractures, ask your health care provider if you should: Be screened for bone loss. Take a calcium or vitamin D supplement to lower your risk of fractures. Be given hormone replacement therapy (HRT) to treat symptoms of menopause. Follow these instructions at home: Alcohol use Do not drink alcohol if: Your health care provider tells you not to drink. You are pregnant, may be pregnant, or are planning to become pregnant. If you  drink alcohol: Limit how much you have to: 0-1 drink a day. Know how much alcohol is in your drink. In the [place] one drink equals one 12 oz bottle of beer (355 mL), one 5 oz glass of wine (148 mL), or one 1 oz glass of hard liquor (44 mL). Lifestyle Do not use any products that contain nicotine or tobacco. These products include cigarettes, chewing tobacco, and vaping devices, such as e-cigarettes. If you need help quitting, ask your health care provider. Do not use street drugs. Do not share needles. Ask your health care provider for help if you need support or information about quitting drugs. General instructions Schedule regular health, dental, and eye exams. Stay current with your vaccines. Tell your health  care provider if: You often feel depressed. You have ever been abused or do not feel safe at home. Summary Adopting a healthy lifestyle and getting preventive care are important in promoting health and wellness. Follow your health care provider's instructions about healthy diet, exercising, and getting tested or screened for diseases. Follow your health care provider's instructions on monitoring your cholesterol and blood pressure. This information is not intended to replace advice given to you by your health care provider. Make sure you discuss any questions you have with your health care provider. Document Revised: 12/10/2020 Document Reviewed: 12/10/2020 Elsevier Patient Education  2024 [employer].

## 2023-01-26 NOTE — Progress Notes (Signed)
MEDICARE ANNUAL WELLNESS VISIT  01/26/2023  Subjective:  Alexandra Barber is a 89 y.o. female patient of Alexandra Butter, NP who had a Medicare Annual Wellness Visit today. Alexandra Barber is Retired and lives with their spouse. she had one adopted child who has passed away. she reports that she is socially active and does interact with friends/family regularly. 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  Patient Care Team: Alexandra Butter, NP as PCP - General (Nurse Practitioner)     01/26/2023    9:31 AM 01/20/2022    9:50 AM 01/15/2021   10:49 AM 10/22/2020    9:23 AM 09/30/2019    8:29 AM 08/15/2019   11:48 AM  Advanced Directives  Does Patient Have a Medical Advance Directive? Yes Yes Yes Yes Yes Yes  Type of Advance Directive Living will Living will;Healthcare Power of Attorney Living will Living will  Living will;Healthcare Power of Attorney;Out of facility DNR (pink MOST or yellow form)  Does patient want to make changes to medical advance directive? No - Patient declined No - Patient declined No - Patient declined No - Patient declined No - Patient declined   Copy of Healthcare Power of Attorney in Chart?  Yes - validated most recent copy scanned in chart (See row information)        Hospital Utilization Over the Past 12 Months: # of hospitalizations or ER visits: 0 # of surgeries: 0  Review of Systems    Patient reports that her overall health is unchanged when compared to last year.  Review of Systems: History obtained from chart review and the patient  All other systems negative.  Pain Assessment Pain : No/denies pain     Current Medications & Allergies (verified) Allergies as of 01/26/2023       Reactions   Hctz [hydrochlorothiazide]    Lovastatin    Zetia [ezetimibe]         Medication List        Accurate as of January 26, 2023  9:52 AM. If you have any questions, ask your nurse or doctor.          calcium  carbonate 750 MG chewable tablet Commonly known as: TUMS EX Chew 2 tablets by mouth as needed for heartburn.   cholecalciferol 25 MCG (1000 UNIT) tablet Commonly known as: VITAMIN D3 Take 1,000 Units by mouth daily.   lisinopril 2.5 MG tablet Commonly known as: ZESTRIL Take 1 tablet (2.5 mg total) by mouth daily.   Omega 3 500 500 MG Caps Take 2 capsules daily What changed: additional instructions   pravastatin 20 MG tablet Commonly known as: PRAVACHOL Take 1 tablet (20 mg total) by mouth daily.        History (reviewed): Past Medical History:  Diagnosis Date   Asthma    Past Surgical History:  Procedure Laterality Date   ABDOMINAL HYSTERECTOMY     BREAST EXCISIONAL BIOPSY Right    in her 69- over 60 years ago   KNEE SURGERY     ovary removed Bilateral    when age 48   ROTATOR CUFF REPAIR Left    Family History  Problem Relation Age of Onset   Cancer Mother    Breast cancer Mother    Breast cancer Paternal Uncle    Social History   Socioeconomic History   Marital status: Married    Spouse name: Alexandra Barber   Number of children: 1   Years of  education: 8   Highest education level: 8th grade  Occupational History    Comment: retired  Tobacco Use   Smoking status: Former   Smokeless tobacco: Never  Building services engineer Use: Never used  Substance and Sexual Activity   Alcohol use: No   Drug use: No   Sexual activity: Not on file  Other Topics Concern   Not on file  Social History Narrative   Lives with her husband. She and her husband, they live with in a senior retirement community center. Her daughter passed away at the age of 67. 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   Social Determinants of Health   Financial Resource Strain: Low Risk  (01/26/2023)   Overall Financial Resource Strain (CARDIA)    Difficulty of Paying Living Expenses: Not hard at all  Food Insecurity: No Food Insecurity (01/26/2023)   Hunger Vital Sign    Worried About  Running Out of Food in the Last Year: Never true    Ran Out of Food in the Last Year: Never true  Transportation Needs: No Transportation Needs (01/26/2023)   PRAPARE - Administrator, Civil Service (Medical): No    Lack of Transportation (Non-Medical): No  Physical Activity: Insufficiently Active (01/26/2023)   Exercise Vital Sign    Days of Exercise per Week: 2 days    Minutes of Exercise per Session: 30 min  Stress: No Stress Concern Present (01/26/2023)   [employer] of Occupational Health - Occupational Stress Questionnaire    Feeling of Stress : Not at all  Social Connections: Moderately Integrated (01/26/2023)   Social Connection and Isolation Panel [NHANES]    Frequency of Communication with Friends and Family: Once a week    Frequency of Social Gatherings with Friends and Family: Once a week    Attends Religious Services: More than 4 times per year    Active Member of [employer] or Organizations: Yes    Attends Banker Meetings: More than 4 times per year    Marital Status: Married    Activities of Daily Living    01/26/2023    9:37 AM  In your present state of health, do you have any difficulty performing the following activities:  Hearing? 0  Vision? 0  Difficulty concentrating or making decisions? 0  Walking or climbing stairs? 0  Dressing or bathing? 0  Doing errands, shopping? 0  Preparing Food and eating ? N  Using the Toilet? N  In the past six months, have you accidently leaked urine? N  Do you have problems with loss of bowel control? N  Managing your Medications? N  Managing your Finances? N  Housekeeping or managing your Housekeeping? N    Patient Education/Literacy How often do you need to have someone help you when you read instructions, pamphlets, or other written materials from your doctor or pharmacy?: 2 - Rarely What is the last grade level you completed in school?: 9th grade  Exercise    Diet Patient reports consuming 2  meals a day and 1 snack(s) a day Patient reports that her primary diet is: Regular Patient reports that she does have regular access to food.   Depression Screen    01/26/2023    9:31 AM 10/30/2022    9:22 AM 05/01/2022    9:26 AM 01/20/2022    9:51 AM 11/22/2021   10:51 AM 10/29/2021    9:51 AM 01/15/2021   10:50 AM  PHQ 2/9  Scores  PHQ - 2 Score 0 0 0 0 0 0 0     Fall Risk    01/26/2023    9:31 AM 10/30/2022    9:21 AM 05/01/2022    9:25 AM 01/20/2022    9:51 AM 11/22/2021   10:51 AM  Fall Risk   Falls in the past year? 0 1 1 1 1   Number falls in past yr: 0 0 0 0 0  Injury with Fall? 0 1 1 1 1   Risk for fall due to : History of fall(s) History of fall(s) History of fall(s) History of fall(s) History of fall(s)  Follow up Falls evaluation completed;Education provided;Falls prevention discussed Falls evaluation completed Falls evaluation completed Falls evaluation completed Falls evaluation completed     Objective:   BP 129/66 (BP Location: Left Arm, Patient Position: Sitting, Cuff Size: Normal)   Pulse 76   Ht 5\' 2"  (1.575 m)   Wt 96 lb 0.6 oz (43.6 kg)   SpO2 100%   BMI 17.57 kg/m   Last Weight  Most recent update: 01/26/2023  9:30 AM    Weight  43.6 kg (96 lb 0.6 oz)             Body mass index is 17.57 kg/m.  Hearing/Vision  Contrina did not have difficulty with hearing/understanding during the face-to-face interview Karrington did not have difficulty with her vision during the face-to-face interview Reports that she has had a formal eye exam by an eye care professional within the past year Reports that she has not had a formal hearing evaluation within the past year  Cognitive Function:    01/26/2023    9:38 AM 01/20/2022    9:59 AM 01/15/2021   10:59 AM  6CIT Screen  What Year? 0 points 0 points 0 points  What month? 0 points 0 points 0 points  What time? 0 points 0 points 0 points  Count back from 20 0 points 0 points 0 points  Months in reverse 0 points 0  points 0 points  Repeat phrase 0 points 2 points 0 points  Total Score 0 points 2 points 0 points    Normal Cognitive Function Screening: Yes (Normal:0-7, Significant for Dysfunction: >8)  Immunization & Health Maintenance Record Immunization History  Administered Date(s) Administered   COVID-19, mRNA, vaccine(Comirnaty)12 years and older 07/16/2022   Fluad Quad(high Dose 65+) 03/30/2019, 05/02/2020, 04/24/2021, 05/01/2022   Influenza, High Dose Seasonal PF 07/08/2013, 05/13/2014, 04/23/2015, 05/05/2018   Influenza, Seasonal, Injecte, Preservative Fre 05/09/2010, 04/09/2012   Influenza,inj,Quad PF,6+ Mos 04/23/2016, 04/21/2017   Influenza-Unspecified 07/08/2013, 05/16/2014, 04/23/2015   Moderna SARS-COV2 Booster Vaccination 06/12/2021   Moderna Sars-Covid-2 Vaccination 08/16/2019, 09/26/2019, 06/12/2020, 12/10/2020, 06/12/2021   Pneumococcal Conjugate-13 04/23/2015   Pneumococcal Polysaccharide-23 12/20/2012   Zoster Recombinat (Shingrix) 04/24/2021, 10/22/2021    Health Maintenance  Topic Date Due   DTaP/Tdap/Td (1 - Tdap) Never done   COVID-19 Vaccine (6 - 2023-24 season) 02/11/2023 (Originally 09/10/2022)   DEXA SCAN  10/13/2028 (Originally 08/27/1998)   INFLUENZA VACCINE  03/05/2023   Medicare Annual Wellness (AWV)  01/26/2024   Pneumonia Vaccine 35+ Years old  Completed   Zoster Vaccines- Shingrix  Completed   HPV VACCINES  Aged Out       Assessment  This is a routine wellness examination for [employer].  Health Maintenance: Due or Overdue Health Maintenance Due  Topic Date Due   DTaP/Tdap/Td (1 - Tdap) Never done    Greig Castilla does not need a  referral for Community Assistance: Care Management:   no Social Work:    no Prescription Assistance:  no Nutrition/Diabetes Education:  no   Plan:  Personalized Goals  Goals Addressed               This Visit's Progress     Patient Stated (pt-stated)        Patient stated that she would like to get  her knee to start feeling better.        Personalized Health Maintenance & Screening Recommendations  Td vaccine Dexa scan  Patient declined the td vaccine and bone density scan.  Lung Cancer Screening Recommended: no (Low Dose CT Chest recommended if Age 59-80 years, 20 pack-year currently smoking OR have quit w/in past 15 years) Hepatitis C Screening recommended: no HIV Screening recommended: not applicable  Advanced Directives: Written information was not given per the patient's request.  Referrals & Orders No orders of the defined types were placed in this encounter.   Follow-up Plan Follow-up with Alexandra Butter, NP as planned Please let us know when you are ready for the bone density scan. Medicare wellness visit in one year.  AVS printed and given to the patient.    I have personally reviewed and noted the following in the patient's chart:   Medical and social history Use of alcohol, tobacco or illicit drugs  Current medications and supplements Functional ability and status Nutritional status Physical activity Advanced directives List of other physicians Hospitalizations, surgeries, and ER visits in previous 12 months Vitals Screenings to include cognitive, depression, and falls Referrals and appointments  In addition, I have reviewed and discussed with patient certain preventive protocols, quality metrics, and best practice recommendations. A written personalized care plan for preventive services as well as general preventive health recommendations were provided to patient.     Modesto Charon, RN BSN  01/26/2023

## 2023-04-22 DIAGNOSIS — R519 Headache, unspecified: Secondary | ICD-10-CM | POA: Diagnosis not present

## 2023-04-22 DIAGNOSIS — Z23 Encounter for immunization: Secondary | ICD-10-CM | POA: Diagnosis not present

## 2023-04-22 DIAGNOSIS — M542 Cervicalgia: Secondary | ICD-10-CM | POA: Diagnosis not present

## 2023-04-22 DIAGNOSIS — R55 Syncope and collapse: Secondary | ICD-10-CM | POA: Diagnosis not present

## 2023-04-22 DIAGNOSIS — S0081XA Abrasion of other part of head, initial encounter: Secondary | ICD-10-CM | POA: Diagnosis not present

## 2023-04-22 DIAGNOSIS — R9431 Abnormal electrocardiogram [ECG] [EKG]: Secondary | ICD-10-CM | POA: Diagnosis not present

## 2023-04-22 DIAGNOSIS — R296 Repeated falls: Secondary | ICD-10-CM | POA: Diagnosis not present

## 2023-04-22 DIAGNOSIS — Z5329 Procedure and treatment not carried out because of patient's decision for other reasons: Secondary | ICD-10-CM | POA: Diagnosis not present

## 2023-04-22 DIAGNOSIS — W1830XA Fall on same level, unspecified, initial encounter: Secondary | ICD-10-CM | POA: Diagnosis not present

## 2023-04-22 DIAGNOSIS — I10 Essential (primary) hypertension: Secondary | ICD-10-CM | POA: Diagnosis not present

## 2023-04-22 DIAGNOSIS — S0990XA Unspecified injury of head, initial encounter: Secondary | ICD-10-CM | POA: Diagnosis not present

## 2023-04-22 DIAGNOSIS — R531 Weakness: Secondary | ICD-10-CM | POA: Diagnosis not present

## 2023-04-22 DIAGNOSIS — S199XXA Unspecified injury of neck, initial encounter: Secondary | ICD-10-CM | POA: Diagnosis not present

## 2023-04-22 DIAGNOSIS — R5383 Other fatigue: Secondary | ICD-10-CM | POA: Diagnosis not present

## 2023-04-23 ENCOUNTER — Ambulatory Visit: Payer: Medicare Other | Admitting: Medical-Surgical

## 2023-04-23 ENCOUNTER — Encounter: Payer: Self-pay | Admitting: Medical-Surgical

## 2023-04-23 VITALS — BP 103/71 | HR 85 | Resp 20 | Ht 62.0 in | Wt 94.1 lb

## 2023-04-23 DIAGNOSIS — R55 Syncope and collapse: Secondary | ICD-10-CM | POA: Diagnosis not present

## 2023-04-23 DIAGNOSIS — Z23 Encounter for immunization: Secondary | ICD-10-CM | POA: Diagnosis not present

## 2023-04-23 DIAGNOSIS — Z09 Encounter for follow-up examination after completed treatment for conditions other than malignant neoplasm: Secondary | ICD-10-CM

## 2023-04-23 NOTE — Progress Notes (Signed)
        Established patient visit  History, exam, impression, and plan:  1. Hospital discharge follow-up 2. Syncope and collapse Pleasant 89 year old female accompanied by her husband presenting today with reports of syncopal episodes x 2 on 9/17.  Notes that she was in the kitchen and abruptly got very dizzy and weak before experiencing transient loss of consciousness and falling to the floor.  She was able to return to consciousness quickly and get up with difficulty.  As she was on her way to her bed to lay down, she again experienced dizziness, weakness, and lost her balance.  She fell, hitting her head on the floor in between the wall and the bed.  This time she reports that she did lose consciousness but is not sure for how long.  When she came around, she was able to get up and get in the bed.  On 9/18, she went to urgent care due to significant aches in her neck, shoulder, and low back.  After evaluation there, they recommended she report to the ED because of significant cervical tenderness on palpation.  Her husband transported her to the ED where they did a plethora of labs and imaging.  Her workup was overall reassuring however shortly after they received this information, they report that another provider was planning to order more tests and labs without full explanation why.  After spending so much time there, they became very frustrated and her husband signed her out AMA.  Today, she notes that her aches and pains have gotten much better.  She has a prescription for tizanidine 2 mg every 6 hours as needed but these were provided as 4 mg capsules and she is unable to take a half dose.  Currently asymptomatic today but is very concerned about this issue.  Outside records from her hospitalization and urgent care visits reviewed.  Concern for cardiogenic cause for her syncopal episodes.  Discussed possible contributing factors of dehydration, poor nutrition, and orthostasis.  Discontinuing  lisinopril with instructions to monitor blood pressure at home with a goal of 130/80 or less.  Cardiopulmonary exam reassuring in office today with no murmurs, gallops, rubs, extra heart sounds, or adventitious lung sounds.  No carotid bruits or thrills noted.  Plan for carotid ultrasound bilaterally and echocardiogram as we have no recent cardiovascular evaluation on file.  Discussed fall prevention in the setting of similar symptoms.  Advised her to make sure that she sits down immediately should she begin to be lightheaded/dizzy and weak to prevent falls and subsequent injury.  Patient and husband verbalized understanding of instructions and are agreeable to the plan. - ECHOCARDIOGRAM COMPLETE; Future - US Carotid Duplex Bilateral; Future  3. Need for influenza vaccination Flu vaccine given in office today. - Flu Vaccine Trivalent High Dose (Fluad)  4. Need for COVID-19 vaccine COVID-vaccine given in office today. - Pfizer Comirnaty Covid -[id number] Vaccine 76yrs and older   Procedures performed this visit: None.  Return if symptoms worsen or fail to improve.  Plan to attend appointment scheduled on 05/04/2023.  __________________________________ Thayer Ohm, DNP, APRN, FNP-BC Primary Care and Sports Medicine [facility] [place]

## 2023-04-24 ENCOUNTER — Telehealth (HOSPITAL_BASED_OUTPATIENT_CLINIC_OR_DEPARTMENT_OTHER): Payer: Self-pay

## 2023-04-28 ENCOUNTER — Ambulatory Visit (HOSPITAL_BASED_OUTPATIENT_CLINIC_OR_DEPARTMENT_OTHER)
Admission: RE | Admit: 2023-04-28 | Discharge: 2023-04-28 | Disposition: A | Discharge status: Home / Self Care | Payer: Medicare Other | Source: Ambulatory Visit | Attending: Medical-Surgical | Admitting: Medical-Surgical

## 2023-04-28 DIAGNOSIS — R55 Syncope and collapse: Secondary | ICD-10-CM | POA: Insufficient documentation

## 2023-04-28 DIAGNOSIS — R42 Dizziness and giddiness: Secondary | ICD-10-CM | POA: Diagnosis not present

## 2023-05-04 ENCOUNTER — Ambulatory Visit: Payer: Medicare Other | Admitting: Medical-Surgical

## 2023-05-04 ENCOUNTER — Encounter: Payer: Self-pay | Admitting: Medical-Surgical

## 2023-05-04 VITALS — BP 136/68 | HR 69 | Resp 20 | Ht 62.0 in | Wt 92.1 lb

## 2023-05-04 DIAGNOSIS — Z862 Personal history of diseases of the blood and blood-forming organs and certain disorders involving the immune mechanism: Secondary | ICD-10-CM | POA: Diagnosis not present

## 2023-05-04 DIAGNOSIS — M1711 Unilateral primary osteoarthritis, right knee: Secondary | ICD-10-CM | POA: Diagnosis not present

## 2023-05-04 DIAGNOSIS — E441 Mild protein-calorie malnutrition: Secondary | ICD-10-CM

## 2023-05-04 DIAGNOSIS — I10 Essential (primary) hypertension: Secondary | ICD-10-CM | POA: Diagnosis not present

## 2023-05-04 DIAGNOSIS — E782 Mixed hyperlipidemia: Secondary | ICD-10-CM

## 2023-05-04 DIAGNOSIS — R55 Syncope and collapse: Secondary | ICD-10-CM

## 2023-05-04 HISTORY — DX: Mild protein-calorie malnutrition: E44.1

## 2023-05-04 NOTE — Progress Notes (Signed)
        Established patient visit  History, exam, impression, and plan:  1. Primary hypertension Pleasant 89 year old female presenting today with a history of primary hypertension.  She was previously taking lisinopril 2.5 mg daily however this was stopped recently due to an episode of syncope and collapse.  Her blood pressures have been running on the lower side of normal and she was feeling a bit weak.  Since stopping medication, she has been monitoring her blood pressure at home with readings in the 130s-140s/60-80.  She denies any concerning symptoms and reports that she has had no further episodes of syncope or near syncope.  Cardiopulmonary exam is normal today.  Blood pressure looks good.  Plan to be a little more permissive for her hypertension control to prevent further episodes of near syncope/syncope.  Completely discontinue lisinopril.  Monitor blood pressure at home with a goal of 140/90 or less.  2. History of iron deficiency anemia She does have a history of iron deficiency anemia however her recent CBC looked good with no indication of continued our deficiency.  Offered to recheck this today however she declined as she is not having any symptoms.  3. Mixed hyperlipidemia Currently taking pravastatin 20 mg daily, tolerating well without side effects.  Following a very low-fat heart healthy diet and stays physically active.  Continue pravastatin as prescribed.  4. Primary osteoarthritis of right knee Reports that the osteoarthritis in her right knee has actually been improved lately.  Feels that the other aches and pains after her collapse have overridden the knee pain but notes that those are getting better as well.  5. Syncope and collapse As noted above, previous syncope and collapse however has had no recurrences of this.  No concerning symptoms and her testing has come back reassuring.  6. Mild protein-calorie malnutrition (HCC) Evaluated her weight today and it looks like  she is slowly losing weight over the last 3 months.  Discussed the importance of adequate nutrition and would recommend increasing her protein intake.  She has recently cut back on carbohydrates as she felt this was related to her dizziness.  Discussed the importance of adding carbohydrates back into her diet slowly and in small portions to prevent further weight loss.  Procedures performed this visit: None.  Return in about 6 months (around 11/01/2023) for chronic disease follow up.  __________________________________ Thayer Ohm, DNP, APRN, FNP-BC Primary Care and Sports Medicine [facility] [place]

## 2023-05-21 ENCOUNTER — Telehealth: Payer: Self-pay | Admitting: Medical-Surgical

## 2023-05-21 DIAGNOSIS — D509 Iron deficiency anemia, unspecified: Secondary | ICD-10-CM | POA: Diagnosis not present

## 2023-05-21 NOTE — Telephone Encounter (Signed)
Toniann Fail called from [facility] in regards to patient she stated that BP was 172/89 today She couldn't pass her Mini Cognitive Test   If have any questions please call [phone] and ask for Toniann Fail

## 2023-05-25 ENCOUNTER — Ambulatory Visit: Payer: Medicare Other | Admitting: Medical-Surgical

## 2023-05-25 ENCOUNTER — Encounter: Payer: Self-pay | Admitting: Medical-Surgical

## 2023-05-25 VITALS — BP 150/73 | HR 66 | Resp 20 | Ht 62.0 in | Wt 95.1 lb

## 2023-05-25 DIAGNOSIS — R4189 Other symptoms and signs involving cognitive functions and awareness: Secondary | ICD-10-CM

## 2023-05-25 DIAGNOSIS — I10 Essential (primary) hypertension: Secondary | ICD-10-CM | POA: Diagnosis not present

## 2023-05-25 NOTE — Progress Notes (Signed)
        Established patient visit  History, exam, impression, and plan:  1. Primary hypertension Very pleasant 89 year old female presenting today with a history of hypertension.  She was previously taken off of lisinopril due to her blood pressure running on the lower side and having a syncopal episode.  Has been off the medication for several weeks and has been doing well overall.  She had a home visit the other day with a nurse who reported that her blood pressure was running extremely high with systolics in the 170s.  Since then she has been monitoring her blood pressures at home with readings in the 140s-150s.  She has lisinopril 2.5 mg at home but has not started retaking it.  Denies any concerning symptoms today.  Cardiopulmonary exam normal.  Advised to restart lisinopril daily as prescribed and continue monitoring blood pressure.  Plan to return in 2 weeks for a nurse visit for blood pressure check.  2. Cognitive change Per the home health nurse, she was concerned about cognitive changes and reports that she failed her mini cog.  On review of the paperwork, it does look like she had a couple of issues with the evaluation.  In person, denies any concerns for cognitive changes or memory loss.  Completed 6CIT eval during the appointment and passed without concerns.  Discussed the expectation for cognitive changes with aging.  At this point, no need for intervention but we will continue to monitor closely in the future for this.  Procedures performed this visit: None.  Return in about 2 weeks (around 06/08/2023) for nurse visit for BP check.  __________________________________ Thayer Ohm, DNP, APRN, FNP-BC Primary Care and Sports Medicine [facility] [place]

## 2023-06-08 ENCOUNTER — Ambulatory Visit (INDEPENDENT_AMBULATORY_CARE_PROVIDER_SITE_OTHER): Payer: Medicare Other

## 2023-06-08 VITALS — BP 125/62 | HR 67

## 2023-06-08 DIAGNOSIS — I10 Essential (primary) hypertension: Secondary | ICD-10-CM

## 2023-06-08 NOTE — Progress Notes (Signed)
   Established Patient Office Visit  Subjective   Patient ID: Alexandra Barber, female    DOB: 09-15-1931  Age: 89 y.o. MRN: [id number]  Chief Complaint  Patient presents with   Hypertension    HPI  Alexandra Barber is here for blood pressure check. Denies chest pain, shortness of breath or dizziness.   ROS    Objective:     BP 125/62   Pulse 67   SpO2 100%    Physical Exam   No results found for any visits on 06/08/23.    The ASCVD Risk score (Arnett DK, et al., 2019) failed to calculate for the following reasons:   The 2019 ASCVD risk score is only valid for ages 33 to 17    Assessment & Plan:  HTN - Alexandra Barber's blood pressure was within normal limits. She has been advised to take the Lisinopril 2.5 mg daily.   Problem List Items Addressed This Visit       Unprioritized   Hypertension - Primary   Relevant Medications   lisinopril (ZESTRIL) 2.5 MG tablet    Return in about 3 months (around 09/08/2023) for HTN with Joy. Earna Coder, Janalyn Harder, CMA

## 2023-06-18 ENCOUNTER — Ambulatory Visit (HOSPITAL_BASED_OUTPATIENT_CLINIC_OR_DEPARTMENT_OTHER)
Admission: RE | Admit: 2023-06-18 | Discharge: 2023-06-18 | Disposition: A | Discharge status: Home / Self Care | Payer: Medicare Other | Source: Ambulatory Visit | Attending: Medical-Surgical | Admitting: Medical-Surgical

## 2023-06-18 DIAGNOSIS — R55 Syncope and collapse: Secondary | ICD-10-CM | POA: Insufficient documentation

## 2023-06-18 LAB — ECHOCARDIOGRAM COMPLETE
AR max vel: 2.01 cm2
AV Area VTI: 1.75 cm2
AV Area mean vel: 1.72 cm2
AV Mean grad: 4 mm[Hg]
AV Peak grad: 6.4 mm[Hg]
Ao pk vel: 1.27 m/s
Area-P 1/2: 4.1 cm2
Calc EF: 61 %
MV M vel: 6.24 m/s
MV Peak grad: 155.8 mm[Hg]
S' Lateral: 2.1 cm
Single Plane A2C EF: 66.5 %
Single Plane A4C EF: 57.6 %

## 2023-06-19 ENCOUNTER — Other Ambulatory Visit (HOSPITAL_BASED_OUTPATIENT_CLINIC_OR_DEPARTMENT_OTHER): Payer: Self-pay | Admitting: Medical-Surgical

## 2023-06-19 ENCOUNTER — Other Ambulatory Visit: Payer: Self-pay | Admitting: Medical-Surgical

## 2023-06-19 DIAGNOSIS — R931 Abnormal findings on diagnostic imaging of heart and coronary circulation: Secondary | ICD-10-CM

## 2023-07-17 DIAGNOSIS — H5203 Hypermetropia, bilateral: Secondary | ICD-10-CM | POA: Diagnosis not present

## 2023-07-27 ENCOUNTER — Ambulatory Visit: Payer: Medicare Other

## 2023-07-27 VITALS — BP 180/90 | HR 59 | Ht 62.0 in | Wt 97.0 lb

## 2023-07-27 DIAGNOSIS — R931 Abnormal findings on diagnostic imaging of heart and coronary circulation: Secondary | ICD-10-CM

## 2023-07-27 DIAGNOSIS — I10 Essential (primary) hypertension: Secondary | ICD-10-CM | POA: Diagnosis not present

## 2023-07-27 HISTORY — DX: Abnormal findings on diagnostic imaging of heart and coronary circulation: R93.1

## 2023-07-27 NOTE — Assessment & Plan Note (Signed)
Degenerative mitral valve changes versus endocarditis based on a small mobile echodensity without any significant mitral valve dysfunction.  Noted on echocardiogram 06/18/2023 done for evaluation of syncope.  No significant concerns for endocarditis based on clinical presentation.  Findings appear to be more suggestive of degenerative changes.  At this time we will hold off on any further invasive testing and will proceed with conservative assessment with repeat echocardiogram in about 3 months.  If no significant changes, it is reassuring and continue with no further intervention. If there are any significant changes on interval follow-up or if she has any recurrent signs of infections or symptoms suggestive of subacute endocarditis, will consider transesophageal echocardiogram.

## 2023-07-27 NOTE — Assessment & Plan Note (Signed)
Labile blood pressures. Blood pressures pretty elevated today at the office. At home readings show normal blood pressures.  She has not had the home device crosscheck with PCP office. Advised her to bring the home device in for cross checking with office devices at her next PCP visit.  Advised her to start taking lisinopril 2.5 mg once a day consistently. Advised her to keep herself well-hydrated to avoid postural hypotension. Advised her precaution with changing positions to avoid orthostatic hypotension.

## 2023-07-27 NOTE — Progress Notes (Signed)
Cardiology Consultation:    Date:  07/27/2023   ID:  Alexandra Barber, DOB Sep 19, 1931, MRN [id number]  PCP:  Alexandra Butter, NP  Cardiologist:  Alexandra Murphy, MD   Referring MD: Alexandra Butter, NP   No chief complaint on file.    ASSESSMENT AND PLAN:   Alexandra Barber 89 year old woman seen today for cardiology consult in the setting of abnormal echocardiogram findings from 06-18-2023 that noted small mobile echodensity is associated with anterior mitral valve leaflet on the atrial aspect, degenerative changes versus endocarditis.  She has history of hypertension, hyperlipidemia, iron deficiency anemia, former smoker [quit over 30 years ago], no significant prior cardiac history, excellent functional status at baseline, independent with her day-to-day activities and actively participates in her church.  Problem List Items Addressed This Visit     Hypertension   Labile blood pressures. Blood pressures pretty elevated today at the office. At home readings show normal blood pressures.  She has not had the home device crosscheck with PCP office. Advised her to bring the home device in for cross checking with office devices at her next PCP visit.  Advised her to start taking lisinopril 2.5 mg once a day consistently. Advised her to keep herself well-hydrated to avoid postural hypotension. Advised her precaution with changing positions to avoid orthostatic hypotension.       Relevant Medications   lisinopril (ZESTRIL) 2.5 MG tablet   Abnormal echocardiogram - Primary   Degenerative mitral valve changes versus endocarditis based on a small mobile echodensity without any significant mitral valve dysfunction.  Noted on echocardiogram 06/18/2023 done for evaluation of syncope.  No significant concerns for endocarditis based on clinical presentation.  Findings appear to be more suggestive of degenerative changes.  At this time we will hold off on any further invasive testing and  will proceed with conservative assessment with repeat echocardiogram in about 3 months.  If no significant changes, it is reassuring and continue with no further intervention. If there are any significant changes on interval follow-up or if she has any recurrent signs of infections or symptoms suggestive of subacute endocarditis, will consider transesophageal echocardiogram.       Relevant Orders   EKG 12-Lead (Completed)   ECHOCARDIOGRAM LIMITED   Return to clinic in 3 months after the repeat limited echocardiogram to assess the mitral valve for any interval change.   History of Present Illness:    Alexandra Barber is a 89 y.o. female who is being seen today for the evaluation of abnormal echocardiogram findings as at the request of Alexandra Butter, NP.   Pleasant woman seen today for visit accompanied by her husband Alexandra Barber.  She has history of hypertension, iron deficiency anemia, hyperlipidemia.  Former smoker [quit over 30 years ago] No significant prior cardiac history.  She has had couple episodes of syncope back in September leading to ER visit on 04/22/2023.  While in the kitchen, with abrupt onset of dizziness and weakness with transient loss of consciousness and falling to the floor.  Another episode soon after while she was returning to the bed.  The following day when they went to the urgent care she was directed to the ER.  No significant abnormalities on workup.  Subsequently was scheduled to follow-up with an echocardiogram.   Echocardiogram from 06/18/2023 done in the setting of evaluation for syncope noted normal LVEF 60 to 65%, grade 1 diastolic dysfunction, no regional wall motion abnormalities.  Normal RV size and function.  There were mobile echodensities reported  with anterior mitral valve leaflet suggestive of degenerative changes versus endocarditis in appropriate clinical context.  No significant aortic valve abnormalities.  No significant valve dysfunction without any  stenosis or regurgitation appreciated on this study.  Reviewed images myself and appears to show trace MR.  Do not see in any other view other than the parasternal long axis.  Highly suspicious for degenerative change  Carotid ultrasound bilateral 04/28/2023 noted no significant abnormality to suggest hemodynamically significant stenosis.  Referred here for further evaluation on the echocardiogram findings. She is remains physically active.  Has not had any further recurrent symptoms.  Tends to have labile blood pressures and lisinopril low-dose 2.5 mg was switched to be used on an as-needed basis and she has not had to take it in many days and she feels good without any symptoms of lightheadedness or dizziness.  Home log of blood pressure readings range from systolic 110s to 604V diastolic in 70s. However blood pressure check here in the office shows systolic 180s over diastolic 90s.  She has remained asymptomatic.  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  No symptoms of fever, chills.  No tooth infections.  No skin infections.  No urinary tract infections.  No other illnesses.   EKG in clinic today shows sinus rhythm heart rate 59/min, normal PR interval 172 ms, QRS duration 72 ms, QTc 470 ms.  Past Medical History:  Diagnosis Date   Asthma     Past Surgical History:  Procedure Laterality Date   ABDOMINAL HYSTERECTOMY     BREAST EXCISIONAL BIOPSY Right    in her 61- over 60 years ago   KNEE SURGERY     ovary removed Bilateral    when age 62   ROTATOR CUFF REPAIR Left     Current Medications: Current Meds  Medication Sig   calcium carbonate (TUMS EX) 750 MG chewable tablet Chew 2 tablets by mouth as needed for heartburn.   cholecalciferol (VITAMIN D3) 25 MCG (1000 UNIT) tablet Take 1,000 Units by mouth daily.   lisinopril (ZESTRIL) 2.5 MG tablet Take 2.5 mg by mouth daily as needed (BP greater than 140).   Omega-3 Fatty Acids (OMEGA 3  500) 500 MG CAPS Take 2 capsules daily (Patient taking differently: Take 2 capsules daily  Takes one capsule)   pravastatin (PRAVACHOL) 20 MG tablet Take 1 tablet (20 mg total) by mouth daily.     Allergies:   Hctz [hydrochlorothiazide], Lovastatin, and Zetia [ezetimibe]   Social History   Socioeconomic History   Marital status: Married    Spouse name: Alexandra Barber   Number of children: 1   Years of education: 8   Highest education level: 8th grade  Occupational History    Comment: retired  Tobacco Use   Smoking status: Former   Smokeless tobacco: Never  Advertising account planner   Vaping status: Never Used  Substance and Sexual Activity   Alcohol use: No   Drug use: No   Sexual activity: Not on file  Other Topics Concern   Not on file  Social History Narrative   Lives with her husband. She and her husband, they live with in a senior retirement community center. Her daughter passed away at the age of 68. 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   Social Drivers of Corporate investment banker Strain: Low Risk  (01/26/2023)   Overall Financial Resource Strain (CARDIA)    Difficulty of Paying Living Expenses: Not hard  at all  Food Insecurity: No Food Insecurity (01/26/2023)   Hunger Vital Sign    Worried About Running Out of Food in the Last Year: Never true    Ran Out of Food in the Last Year: Never true  Transportation Needs: No Transportation Needs (01/26/2023)   PRAPARE - Administrator, Civil Service (Medical): No    Lack of Transportation (Non-Medical): No  Physical Activity: Insufficiently Active (01/26/2023)   Exercise Vital Sign    Days of Exercise per Week: 2 days    Minutes of Exercise per Session: 30 min  Stress: No Stress Concern Present (01/26/2023)   [employer] of Occupational Health - Occupational Stress Questionnaire    Feeling of Stress : Not at all  Social Connections: Unknown (04/22/2023)   Received from [facility]   Social Network    Social  Network: Not on file     Family History: The patient's family history includes Breast cancer in her mother and paternal uncle; Cancer in her mother. ROS:   Please see the history of present illness.    All 14 point review of systems negative except as described per history of present illness.  EKGs/Labs/Other Studies Reviewed:    The following studies were reviewed today:   EKG:  EKG Interpretation Date/Time:  Monday July 27 2023 14:57:22 EST Ventricular Rate:  59 PR Interval:  172 QRS Duration:  72 QT Interval:  422 QTC Calculation: 417 R Axis:   22  Text Interpretation: Sinus bradycardia with sinus arrhythmia No previous ECGs available Confirmed by Huntley Dec reddy [phone]) on 07/27/2023 3:13:11 PM    Recent Labs: No results found for requested labs within last 365 days.  Recent Lipid Panel    Component Value Date/Time   CHOL 226 (H) 05/01/2022 0000   TRIG 65 05/01/2022 0000   HDL 80 05/01/2022 0000   CHOLHDL 2.8 05/01/2022 0000   VLDL 12 09/09/2016 1117   LDLCALC 130 (H) 05/01/2022 0000    Physical Exam:    VS:  BP (!) 180/90 (BP Location: Left Arm, Patient Position: Sitting, Cuff Size: Small)   Pulse (!) 59   Ht 5\' 2"  (1.575 m)   Wt 97 lb (44 kg)   SpO2 99%   BMI 17.74 kg/m     Wt Readings from Last 3 Encounters:  07/27/23 97 lb (44 kg)  05/25/23 95 lb 1.3 oz (43.1 kg)  05/04/23 92 lb 1.9 oz (41.8 kg)     GENERAL:  Well nourished, well developed in no acute distress NECK: No JVD; No carotid bruits CARDIAC: RRR, S1 and S2 present, no murmurs, no rubs, no gallops CHEST:  Clear to auscultation without rales, wheezing or rhonchi  Extremities: No pitting pedal edema. Pulses bilaterally symmetric with radial 2+ and dorsalis pedis 2+ NEUROLOGIC:  Alert and oriented x 3  Medication Adjustments/Labs and Tests Ordered: Current medicines are reviewed at length with the patient today.  Concerns regarding medicines are outlined above.  Orders Placed  This Encounter  Procedures   EKG 12-Lead   ECHOCARDIOGRAM LIMITED   No orders of the defined types were placed in this encounter.   Signed, Cecille Amsterdam, MD, MPH, [facility]. 07/27/2023 3:45 PM    [facility]

## 2023-07-27 NOTE — Patient Instructions (Signed)
Medication Instructions:  Your physician recommends that you continue on your current medications as directed. Please refer to the Current Medication list given to you today.  *If you need a refill on your cardiac medications before your next appointment, please call your pharmacy*   Lab Work: None Ordered If you have labs (blood work) drawn today and your tests are completely normal, you will receive your results only by: MyChart Message (if you have MyChart) OR A paper copy in the mail If you have any lab test that is abnormal or we need to change your treatment, we will call you to review the results.   Testing/Procedures: Echocardiogram An echocardiogram is a test that uses sound waves (ultrasound) to produce images of the heart. Images from an echocardiogram can provide important information about: Heart size and shape. The size and thickness and movement of your heart's walls. Heart muscle function and strength. Heart valve function or if you have stenosis. Stenosis is when the heart valves are too narrow. If blood is flowing backward through the heart valves (regurgitation). A tumor or infectious growth around the heart valves. Areas of heart muscle that are not working well because of poor blood flow or injury from a heart attack. Aneurysm detection. An aneurysm is a weak or damaged part of an artery wall. The wall bulges out from the normal force of blood pumping through the body. Tell a health care provider about: Any allergies you have. All medicines you are taking, including vitamins, herbs, eye drops, creams, and over-the-counter medicines. Any blood disorders you have. Any surgeries you have had. Any medical conditions you have. Whether you are pregnant or may be pregnant. What are the risks? Generally, this is a safe test. However, problems may occur, including an allergic reaction to dye (contrast) that may be used during the test. What happens before the test? No  specific preparation is needed. You may eat and drink normally. What happens during the test?  You will take off your clothes from the waist up and put on a hospital gown. Electrodes or electrocardiogram (ECG)patches may be placed on your chest. The electrodes or patches are then connected to a device that monitors your heart rate and rhythm. You will lie down on a table for an ultrasound exam. A gel will be applied to your chest to help sound waves pass through your skin. A handheld device, called a transducer, will be pressed against your chest and moved over your heart. The transducer produces sound waves that travel to your heart and bounce back (or "echo" back) to the transducer. These sound waves will be captured in real-time and changed into images of your heart that can be viewed on a video monitor. The images will be recorded on a computer and reviewed by your health care provider. You may be asked to change positions or hold your breath for a short time. This makes it easier to get different views or better views of your heart. In some cases, you may receive contrast through an IV in one of your veins. This can improve the quality of the pictures from your heart. The procedure may vary among health care providers and hospitals. What can I expect after the test? You may return to your normal, everyday life, including diet, activities, and medicines, unless your health care provider tells you not to do that. Follow these instructions at home: It is up to you to get the results of your test. Ask your health care provider,  or the department that is doing the test, when your results will be ready. Keep all follow-up visits. This is important. Summary An echocardiogram is a test that uses sound waves (ultrasound) to produce images of the heart. Images from an echocardiogram can provide important information about the size and shape of your heart, heart muscle function, heart valve function, and  other possible heart problems. You do not need to do anything to prepare before this test. You may eat and drink normally. After the echocardiogram is completed, you may return to your normal, everyday life, unless your health care provider tells you not to do that. This information is not intended to replace advice given to you by your health care provider. Make sure you discuss any questions you have with your health care provider. Document Revised: 04/03/2021 Document Reviewed: 03/13/2020 Elsevier Patient Education  2023 Elsevier Inc.       Follow-Up: At [facility], you and your health needs are our priority.  As part of our continuing mission to provide you with exceptional heart care, we have created designated Provider Care Teams.  These Care Teams include your primary Cardiologist (physician) and Advanced Practice Providers (APPs -  Physician Assistants and Nurse Practitioners) who all work together to provide you with the care you need, when you need it.  We recommend signing up for the patient portal called "MyChart".  Sign up information is provided on this After Visit Summary.  MyChart is used to connect with patients for Virtual Visits (Telemedicine).  Patients are able to view lab/test results, encounter notes, upcoming appointments, etc.  Non-urgent messages can be sent to your provider as well.   To learn more about what you can do with MyChart, go to [email or web address].    Your next appointment:   After your echo

## 2023-07-28 ENCOUNTER — Telehealth: Payer: Self-pay

## 2023-07-28 NOTE — Telephone Encounter (Signed)
Wife and husband both say Dr Madireddy told them at ov on 07/27/23 that he would order an Echo to be done in 3 months and f/u up after that. Echo was sch for 08/27/23 and f/u on 09/08/23. They say the appts are too soon. Please Advise

## 2023-07-30 ENCOUNTER — Telehealth: Payer: Self-pay | Admitting: Medical-Surgical

## 2023-07-30 NOTE — Telephone Encounter (Signed)
Patient called with questions about her bp machine. Patient purchased the machine from [facility] and was told she could speak to someone if she had any questions. Please advise

## 2023-08-04 ENCOUNTER — Encounter: Payer: Self-pay | Admitting: Medical-Surgical

## 2023-08-04 DIAGNOSIS — Z0000 Encounter for general adult medical examination without abnormal findings: Secondary | ICD-10-CM

## 2023-08-17 ENCOUNTER — Ambulatory Visit: Payer: Medicare Other

## 2023-08-27 ENCOUNTER — Other Ambulatory Visit (HOSPITAL_BASED_OUTPATIENT_CLINIC_OR_DEPARTMENT_OTHER): Payer: Medicare Other

## 2023-09-08 ENCOUNTER — Ambulatory Visit: Payer: Medicare Other

## 2023-09-29 ENCOUNTER — Other Ambulatory Visit: Payer: Self-pay

## 2023-09-29 DIAGNOSIS — I059 Rheumatic mitral valve disease, unspecified: Secondary | ICD-10-CM

## 2023-09-29 DIAGNOSIS — I10 Essential (primary) hypertension: Secondary | ICD-10-CM

## 2023-09-29 DIAGNOSIS — R931 Abnormal findings on diagnostic imaging of heart and coronary circulation: Secondary | ICD-10-CM

## 2023-10-22 DIAGNOSIS — J45909 Unspecified asthma, uncomplicated: Secondary | ICD-10-CM | POA: Insufficient documentation

## 2023-10-26 ENCOUNTER — Ambulatory Visit (HOSPITAL_BASED_OUTPATIENT_CLINIC_OR_DEPARTMENT_OTHER)
Admission: RE | Admit: 2023-10-26 | Discharge: 2023-10-26 | Disposition: A | Discharge status: Home / Self Care | Payer: Medicare Other | Source: Ambulatory Visit

## 2023-10-26 DIAGNOSIS — R931 Abnormal findings on diagnostic imaging of heart and coronary circulation: Secondary | ICD-10-CM | POA: Insufficient documentation

## 2023-10-26 DIAGNOSIS — I059 Rheumatic mitral valve disease, unspecified: Secondary | ICD-10-CM | POA: Insufficient documentation

## 2023-10-26 DIAGNOSIS — I341 Nonrheumatic mitral (valve) prolapse: Secondary | ICD-10-CM

## 2023-10-26 LAB — ECHOCARDIOGRAM COMPLETE
AR max vel: 1.84 cm2
AV Area VTI: 2.07 cm2
AV Area mean vel: 1.86 cm2
AV Mean grad: 3 mmHg
AV Peak grad: 6.2 mmHg
Ao pk vel: 1.24 m/s
Area-P 1/2: 3.83 cm2
Calc EF: 68.5 %
MV M vel: 4.59 m/s
MV Peak grad: 84.2 mmHg
S' Lateral: 2 cm
Single Plane A2C EF: 69.4 %
Single Plane A4C EF: 66.7 %

## 2023-10-28 ENCOUNTER — Ambulatory Visit: Payer: Medicare Other

## 2023-10-28 VITALS — BP 112/80 | HR 65 | Ht 61.0 in | Wt 97.0 lb

## 2023-10-28 DIAGNOSIS — I340 Nonrheumatic mitral (valve) insufficiency: Secondary | ICD-10-CM

## 2023-10-28 NOTE — Progress Notes (Signed)
 Cardiology Consultation:    Date:  10/28/2023   ID:  Alexandra Barber, DOB 07-Feb-1932, MRN [id number]  PCP:  Christen Butter, NP  Cardiologist:  Luretha Murphy, MD   Referring MD: Christen Butter, NP   No chief complaint on file.    ASSESSMENT AND PLAN:   Ms. Thal 89 year old woman with history of hypertension, hyperlipidemia, iron deficiency anemia, former smoker [quit 30 years ago], no significant prior cardiac history, echocardiogram with normal biventricular function, trace to mild mitral regurgitation with degenerative appearance without any significant mobile echodensities.  Findings of the mitral valve less likely to be suggestive of endocarditis.  Problem List Items Addressed This Visit     Mild mitral regurgitation - Primary   Mitral valve associated with degenerative like changes.  There was question about mobile echodensity on prior echocardiogram November 2024.  Repeat echocardiogram March 2025 with no major changes.  Trace to mild mitral regurgitation and degenerative changes of the mitral valve and mitral annular calcification.  Not consistent with vegetation based on her clinical presentation.  No further testing required at this time.  Will tentatively follow-up with her in 1 year and reassess with an echocardiogram based on her symptoms at the time.       Relevant Medications   omega-3 acid ethyl esters (LOVAZA) 1 g capsule    Return to clinic tentatively in 1 year.  History of Present Illness:    Alexandra Barber is a 89 y.o. female who is being seen today for follow-up visit. Last visit with me in the office was 07-27-2023. PCP is Christen Butter, NP.  There was concern about small mobile echodensity on echocardiogram from November 2024 on the anterior mitral valve leaflet which appear degenerative versus endocarditis.  She also has history of hypertension, hyperlipidemia, iron deficiency anemia, former smoker [quit 30 years ago], reported couple episodes of  syncope in September 2024 without any further recurrence.  Normal biventricular function on echocardiogram November 2024 with grade 1 diastolic dysfunction and mitral valve findings as noted above.  Pleasant woman here for the visit accompanied by her husband.  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  Denies any chest pain, shortness of breath.  Denies any palpitations.  Mentions good functional capacity and able to keep up with her day-to-day activities at home with cleaning cooking and that the church with her dancing.  Denies any fevers or chills.  Denies any night sweats.  Denies any blood in urine or stools.  Mentions blood pressures at home well-controlled.  Uses lisinopril as needed but has not required it in over 2 months.  Echocardiogram repeated 10-26-2023 now notes LVEF 60 to 65%, mild LVH, normal RV size and function.  Mitral valve appears to be degenerative with trace to mild mitral regurgitation.  No individually mobile echodensities observed.  Findings on prior echocardiogram most likely related to degenerative disease.  Aortic valve with moderate Lee thickened leaflets.  No aortic stenosis or significant regurgitation observed.  Trace tricuspid regurgitation noted.  Past Medical History:  Diagnosis Date   Abnormal echocardiogram 07/27/2023   Asthma    History of iron deficiency anemia 09/09/2016   Hyperlipidemia 09/09/2016   Hypertension 09/09/2016   Mild protein-calorie malnutrition (HCC) 05/04/2023   Osteoarthritis 09/09/2016   Osteoporosis 09/09/2016   Popping of right knee joint 11/22/2021   Primary osteoarthritis of right knee 02/20/2022    Past Surgical History:  Procedure Laterality Date   ABDOMINAL HYSTERECTOMY  BREAST EXCISIONAL BIOPSY Right    in her 22- over 60 years ago   KNEE SURGERY     ovary removed Bilateral    when age 78   ROTATOR CUFF REPAIR Left     Current Medications: Current Meds  Medication Sig    calcium carbonate (TUMS EX) 750 MG chewable tablet Chew 2 tablets by mouth as needed for heartburn.   cholecalciferol (VITAMIN D3) 25 MCG (1000 UNIT) tablet Take 1,000 Units by mouth daily.   lisinopril (ZESTRIL) 2.5 MG tablet Take 2.5 mg by mouth daily as needed (BP greater than 140).   omega-3 acid ethyl esters (LOVAZA) 1 g capsule Take 1 capsule by mouth daily.   pravastatin (PRAVACHOL) 20 MG tablet Take 1 tablet (20 mg total) by mouth daily.     Allergies:   Hctz [hydrochlorothiazide], Lovastatin, and Zetia [ezetimibe]   Social History   Socioeconomic History   Marital status: Married    Spouse name: [name]   Number of children: 1   Years of education: 8   Highest education level: 8th grade  Occupational History    Comment: retired  Tobacco Use   Smoking status: Former   Smokeless tobacco: Never  Advertising account planner   Vaping status: Never Used  Substance and Sexual Activity   Alcohol use: No   Drug use: No   Sexual activity: Not on file  Other Topics Concern   Not on file  Social History Narrative   Lives with her husband. She and her husband, they live with in a senior retirement community center. Her daughter passed away at the age of 9. 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   Social Drivers of Corporate investment banker Strain: Low Risk  (01/26/2023)   Overall Financial Resource Strain (CARDIA)    Difficulty of Paying Living Expenses: Not hard at all  Food Insecurity: No Food Insecurity (01/26/2023)   Hunger Vital Sign    Worried About Running Out of Food in the Last Year: Never true    Ran Out of Food in the Last Year: Never true  Transportation Needs: No Transportation Needs (01/26/2023)   PRAPARE - Administrator, Civil Service (Medical): No    Lack of Transportation (Non-Medical): No  Physical Activity: Insufficiently Active (01/26/2023)   Exercise Vital Sign    Days of Exercise per Week: 2 days    Minutes of Exercise per Session: 30 min  Stress:  No Stress Concern Present (01/26/2023)   [employer] of Occupational Health - Occupational Stress Questionnaire    Feeling of Stress : Not at all  Social Connections: Unknown (04/22/2023)   Received from [facility]   Social Network    Social Network: Not on file     Family History: The patient's family history includes Breast cancer in her mother and paternal uncle; Cancer in her mother. ROS:   Please see the history of present illness.    All 14 point review of systems negative except as described per history of present illness.  EKGs/Labs/Other Studies Reviewed:    The following studies were reviewed today:   EKG:       Recent Labs: No results found for requested labs within last 365 days.  Recent Lipid Panel    Component Value Date/Time   CHOL 226 (H) 05/01/2022 0000   TRIG 65 05/01/2022 0000   HDL 80 05/01/2022 0000   CHOLHDL 2.8 05/01/2022 0000   VLDL 12 09/09/2016 1117  LDLCALC 130 (H) 05/01/2022 0000    Physical Exam:    VS:  BP 112/80   Pulse 65   Ht 5\' 1"  (1.549 m)   Wt 97 lb (44 kg)   SpO2 97%   BMI 18.33 kg/m     Wt Readings from Last 3 Encounters:  10/28/23 97 lb (44 kg)  07/27/23 97 lb (44 kg)  05/25/23 95 lb 1.3 oz (43.1 kg)     GENERAL:  Well nourished, well developed in no acute distress NECK: No JVD; No carotid bruits CARDIAC: RRR, S1 and S2 present, no murmurs, no rubs, no gallops CHEST:  Clear to auscultation without rales, wheezing or rhonchi  Extremities: No pitting pedal edema. Pulses bilaterally symmetric with radial 2+ and dorsalis pedis 2+ NEUROLOGIC:  Alert and oriented x 3  Medication Adjustments/Labs and Tests Ordered: Current medicines are reviewed at length with the patient today.  Concerns regarding medicines are outlined above.  No orders of the defined types were placed in this encounter.  No orders of the defined types were placed in this encounter.   Signed, Cecille Amsterdam, MD, MPH, [facility]. 10/28/2023  9:46 AM    [facility]

## 2023-10-28 NOTE — Assessment & Plan Note (Signed)
 Mitral valve associated with degenerative like changes.  There was question about mobile echodensity on prior echocardiogram November 2024.  Repeat echocardiogram March 2025 with no major changes.  Trace to mild mitral regurgitation and degenerative changes of the mitral valve and mitral annular calcification.  Not consistent with vegetation based on her clinical presentation.  No further testing required at this time.  Will tentatively follow-up with her in 1 year and reassess with an echocardiogram based on her symptoms at the time.

## 2023-10-28 NOTE — Patient Instructions (Signed)
 Medication Instructions:  Your physician recommends that you continue on your current medications as directed. Please refer to the Current Medication list given to you today.  *If you need a refill on your cardiac medications before your next appointment, please call your pharmacy*   Lab Work: None Ordered If you have labs (blood work) drawn today and your tests are completely normal, you will receive your results only by: MyChart Message (if you have MyChart) OR A paper copy in the mail If you have any lab test that is abnormal or we need to change your treatment, we will call you to review the results.   Testing/Procedures: None Ordered   Follow-Up: At [facility], you and your health needs are our priority.  As part of our continuing mission to provide you with exceptional heart care, we have created designated Provider Care Teams.  These Care Teams include your primary Cardiologist (physician) and Advanced Practice Providers (APPs -  Physician Assistants and Nurse Practitioners) who all work together to provide you with the care you need, when you need it.  We recommend signing up for the patient portal called "MyChart".  Sign up information is provided on this After Visit Summary.  MyChart is used to connect with patients for Virtual Visits (Telemedicine).  Patients are able to view lab/test results, encounter notes, upcoming appointments, etc.  Non-urgent messages can be sent to your provider as well.   To learn more about what you can do with MyChart, go to [email or web address].    Your next appointment: 1 year follow up

## 2023-11-02 ENCOUNTER — Ambulatory Visit: Payer: Medicare Other | Admitting: Medical-Surgical

## 2023-11-06 ENCOUNTER — Ambulatory Visit: Admitting: Medical-Surgical

## 2023-11-06 ENCOUNTER — Encounter: Payer: Self-pay | Admitting: Medical-Surgical

## 2023-11-06 VITALS — BP 136/77 | HR 71 | Resp 20 | Ht 61.0 in | Wt 96.6 lb

## 2023-11-06 DIAGNOSIS — I10 Essential (primary) hypertension: Secondary | ICD-10-CM | POA: Diagnosis not present

## 2023-11-06 DIAGNOSIS — E782 Mixed hyperlipidemia: Secondary | ICD-10-CM | POA: Diagnosis not present

## 2023-11-06 MED ORDER — PRAVASTATIN SODIUM 20 MG PO TABS: 20.0000 mg | ORAL_TABLET | Freq: Every day | ORAL | 3 refills | Status: AC

## 2023-11-06 NOTE — Progress Notes (Signed)
        Established patient visit  History, exam, impression, and plan:  1. Primary hypertension (Primary) Very pleasant 89 year old female presenting today with a history of primary hypertension that is currently managed with diet and exercise.  She has a prescription for lisinopril 2.5 mg daily that is used as needed depending on her blood pressure.  She is checking blood pressures at home.  Following a low-sodium diet.  Stays physically active with walking and dancing.  No concerning symptoms today.  Cardiopulmonary exam is normal.  Blood pressure is 136/77.  Given her age, blood pressure control is more permissive.  Recommend her blood pressure stay below 140/90.  Continue lisinopril 2.5 mg daily as needed.  2. Mixed hyperlipidemia History of hyperlipidemia currently treated with pravastatin 20 mg daily.  Tolerating the medication well without side effects.  Up-to-date on lipid checks.  Continue pravastatin as prescribed.   Procedures performed this visit: None.  No follow-ups on file.  __________________________________ Thayer Ohm, DNP, APRN, FNP-BC Primary Care and Sports Medicine [facility] [place]

## 2023-11-08 ENCOUNTER — Encounter: Payer: Self-pay | Admitting: Medical-Surgical

## 2024-01-27 ENCOUNTER — Ambulatory Visit: Payer: Medicare Other

## 2024-01-27 VITALS — BP 133/72 | HR 66 | Ht 62.0 in | Wt 95.0 lb

## 2024-01-27 DIAGNOSIS — Z0000 Encounter for general adult medical examination without abnormal findings: Secondary | ICD-10-CM | POA: Diagnosis not present

## 2024-01-27 NOTE — Patient Instructions (Signed)
  Alexandra Barber , Thank you for taking time to come for your Medicare Wellness Visit. I appreciate your ongoing commitment to your health goals. Please review the following plan we discussed and let me know if I can assist you in the future.   These are the goals we discussed:  Goals       Patient Stated (pt-stated)      01/15/2021 AWV Goal: Exercise for General Health  Patient will verbalize understanding of the benefits of increased physical activity: Exercising regularly is important. It will improve your overall fitness, flexibility, and endurance. Regular exercise also will improve your overall health. It can help you control your weight, reduce stress, and improve your bone density. Over the next year, patient will increase physical activity as tolerated with a goal of at least 150 minutes of moderate physical activity per week.  You can tell that you are exercising at a moderate intensity if your heart starts beating faster and you start breathing faster but can still hold a conversation. Moderate-intensity exercise ideas include: Walking 1 mile (1.6 km) in about 15 minutes Biking Hiking Golfing Dancing Water aerobics Patient will verbalize understanding of everyday activities that increase physical activity by providing examples like the following: Yard work, such as: Insurance underwriter Gardening Washing windows or floors Patient will be able to explain general safety guidelines for exercising:  Before you start a new exercise program, talk with your health care provider. Do not exercise so much that you hurt yourself, feel dizzy, or get very short of breath. Wear comfortable clothes and wear shoes with good support. Drink plenty of water while you exercise to prevent dehydration or heat stroke. Work out until your breathing and your heartbeat get faster.       Patient Stated (pt-stated)       01/20/2022 AWV Goal: Fall Prevention  Over the next year, patient will decrease their risk for falls by: Using assistive devices, such as a cane or walker, as needed Identifying fall risks within their home and correcting them by: Removing throw rugs Adding handrails to stairs or ramps Removing clutter and keeping a clear pathway throughout the home Increasing light, especially at night Adding shower handles/bars Raising toilet seat Identifying potential personal risk factors for falls: Medication side effects Incontinence/urgency Vestibular dysfunction Hearing loss Musculoskeletal disorders Neurological disorders Orthostatic hypotension        Patient Stated (pt-stated)      Patient stated that she would like to get her knee to start feeling better.       Patient Stated      Patient states she would like to maintain a healthy lifestyle.          This is a list of the screening recommended for you and due dates:  Health Maintenance  Topic Date Due   COVID-19 Vaccine (7 - Moderna risk 2024-25 season) 10/21/2023   DEXA scan (bone density measurement)  10/13/2028*   Flu Shot  03/04/2024   Medicare Annual Wellness Visit  01/26/2025   Pneumococcal Vaccine for age over 36  Completed   Zoster (Shingles) Vaccine  Completed   Hepatitis B Vaccine  Aged Out   HPV Vaccine  Aged Out   Meningitis B Vaccine  Aged Out   DTaP/Tdap/Td vaccine  Discontinued  *Topic was postponed. The date shown is not the original due date.

## 2024-01-27 NOTE — Progress Notes (Signed)
 Subjective:   Alexandra Barber is a 89 y.o. female who presents for Medicare Annual (Subsequent) preventive examination.  Visit Complete: In person  Patient Medicare AWV questionnaire was completed by the patient on N/A; I have confirmed that all information answered by patient is correct and no changes since this date.  Cardiac Risk Factors include: advanced age (>73men, >46 women);hypertension;dyslipidemia;smoking/ tobacco exposure     Objective:    Today's Vitals   01/27/24 0943  BP: 133/72  Pulse: 66  SpO2: 100%  Weight: 95 lb (43.1 kg)  Height: 5' 2 (1.575 m)   Body mass index is 17.38 kg/m.     01/27/2024   10:09 AM 01/26/2023    9:31 AM 01/20/2022    9:50 AM 01/15/2021   10:49 AM 10/22/2020    9:23 AM 09/30/2019    8:29 AM 08/15/2019   11:48 AM  Advanced Directives  Does Patient Have a Medical Advance Directive? Yes Yes Yes Yes Yes Yes Yes  Type of Advance Directive Living will Living will Living will;Healthcare Power of Attorney Living will Living will  Living will;Healthcare Power of Attorney;Out of facility DNR (pink MOST or yellow form)  Does patient want to make changes to medical advance directive? No - Patient declined No - Patient declined No - Patient declined No - Patient declined No - Patient declined No - Patient declined   Copy of Healthcare Power of Attorney in Chart?   Yes - validated most recent copy scanned in chart (See row information)        Current Medications (verified) Outpatient Encounter Medications as of 01/27/2024  Medication Sig   calcium carbonate (TUMS EX) 750 MG chewable tablet Chew 2 tablets by mouth as needed for heartburn.   cholecalciferol (VITAMIN D3) 25 MCG (1000 UNIT) tablet Take 1,000 Units by mouth daily.   lisinopril  (ZESTRIL ) 2.5 MG tablet Take 2.5 mg by mouth daily as needed (BP greater than 140).   Omega-3 Fatty Acids (FISH OIL) 500 MG CAPS Take 500 mg by mouth daily.   pravastatin  (PRAVACHOL ) 20 MG tablet Take 1 tablet (20  mg total) by mouth daily.   omega-3 acid ethyl esters (LOVAZA) 1 g capsule Take 1 capsule by mouth daily. (Patient not taking: Reported on 01/27/2024)   No facility-administered encounter medications on file as of 01/27/2024.    Allergies (verified) Hctz [hydrochlorothiazide], Lovastatin, and Zetia [ezetimibe]   History: Past Medical History:  Diagnosis Date   Abnormal echocardiogram 07/27/2023   Asthma    History of iron deficiency anemia 09/09/2016   Hyperlipidemia 09/09/2016   Hypertension 09/09/2016   Mild protein-calorie malnutrition (HCC) 05/04/2023   Osteoarthritis 09/09/2016   Osteoporosis 09/09/2016   Popping of right knee joint 11/22/2021   Primary osteoarthritis of right knee 02/20/2022   Past Surgical History:  Procedure Laterality Date   ABDOMINAL HYSTERECTOMY     BREAST EXCISIONAL BIOPSY Right    in her 20- over 60 years ago   KNEE SURGERY     ovary removed Bilateral    when age 33   ROTATOR CUFF REPAIR Left    Family History  Problem Relation Age of Onset   Cancer Mother    Breast cancer Mother    Breast cancer Paternal Uncle    Social History   Socioeconomic History   Marital status: Married    Spouse name: Elsie   Number of children: 1   Years of education: 8   Highest education level: 8th grade  Occupational History  Comment: retired  Tobacco Use   Smoking status: Former    Current packs/day: 0.00    Average packs/day: 0.5 packs/day for 31.6 years (15.8 ttl pk-yrs)    Types: Cigarettes    Start date: 03/12/1948    Quit date: 10/09/1979    Years since quitting: 44.3   Smokeless tobacco: Never  Vaping Use   Vaping status: Never Used  Substance and Sexual Activity   Alcohol use: No   Drug use: No   Sexual activity: Not on file  Other Topics Concern   Not on file  Social History Narrative   Lives with her husband. She and her husband, they live with in a senior retirement community center. Her daughter passed away at the age of 55. 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   Social Drivers of Corporate investment banker Strain: Low Risk  (01/27/2024)   Overall Financial Resource Strain (CARDIA)    Difficulty of Paying Living Expenses: Not hard at all  Food Insecurity: No Food Insecurity (01/27/2024)   Hunger Vital Sign    Worried About Running Out of Food in the Last Year: Never true    Ran Out of Food in the Last Year: Never true  Transportation Needs: No Transportation Needs (01/27/2024)   PRAPARE - Administrator, Civil Service (Medical): No    Lack of Transportation (Non-Medical): No  Physical Activity: Insufficiently Active (01/27/2024)   Exercise Vital Sign    Days of Exercise per Week: 7 days    Minutes of Exercise per Session: 20 min  Stress: No Stress Concern Present (01/27/2024)   [employer] of Occupational Health - Occupational Stress Questionnaire    Feeling of Stress: Not at all  Social Connections: Socially Integrated (01/27/2024)   Social Connection and Isolation Panel    Frequency of Communication with Friends and Family: Once a week    Frequency of Social Gatherings with Friends and Family: More than three times a week    Attends Religious Services: More than 4 times per year    Active Member of [employer] or Organizations: Yes    Attends Engineer, structural: More than 4 times per year    Marital Status: Married    Tobacco Counseling Counseling given: Not Answered   Clinical Intake:  Pre-visit preparation completed: Yes  Pain : No/denies pain     BMI - recorded: 17.38 Nutritional Status: BMI <19  Underweight Nutritional Risks: None Diabetes: No  How often do you need to have someone help you when you read instructions, pamphlets, or other written materials from your doctor or pharmacy?: 1 - Never  Interpreter Needed?: No  Comments: 8   Activities of Daily Living    01/27/2024    9:48 AM  In your present state of health, do you have any difficulty  performing the following activities:  Hearing? 0  Vision? 0  Difficulty concentrating or making decisions? 0  Walking or climbing stairs? 0  Dressing or bathing? 0  Doing errands, shopping? 0  Preparing Food and eating ? N  Using the Toilet? N  In the past six months, have you accidently leaked urine? N  Do you have problems with loss of bowel control? N  Managing your Medications? N  Managing your Finances? N  Housekeeping or managing your Housekeeping? N    Patient Care Team: Willo Mini, NP as PCP - General (Nurse Practitioner)  Indicate any recent Medical Services you may have received  from other than [facility] providers in the past year (date may be approximate).     Assessment:   This is a routine wellness examination for Alexandra Barber.  Hearing/Vision screen No results found.   Goals Addressed             This Visit's Progress    Patient Stated       Patient states she would like to maintain a healthy lifestyle.         Depression Screen    01/27/2024   10:06 AM 11/06/2023   10:49 AM 01/26/2023    9:31 AM 10/30/2022    9:22 AM 05/01/2022    9:26 AM 01/20/2022    9:51 AM 11/22/2021   10:51 AM  PHQ 2/9 Scores  PHQ - 2 Score 0 0 0 0 0 0 0    Fall Risk    01/27/2024   10:10 AM 11/06/2023   10:48 AM 01/26/2023    9:31 AM 10/30/2022    9:21 AM 05/01/2022    9:25 AM  Fall Risk   Falls in the past year? 1 1 0 1 1  Number falls in past yr: 0 0 0 0 0  Injury with Fall? 0 0 0 1 1  Risk for fall due to : History of fall(s)  History of fall(s) History of fall(s) History of fall(s)  Follow up Falls evaluation completed Falls evaluation completed Falls evaluation completed;Education provided;Falls prevention discussed Falls evaluation completed Falls evaluation completed      Data saved with a previous flowsheet row definition    MEDICARE RISK AT HOME: Medicare Risk at Home Any stairs in or around the home?: Yes If so, are there any without handrails?: Yes Home free of  loose throw rugs in walkways, pet beds, electrical cords, etc?: Yes Adequate lighting in your home to reduce risk of falls?: Yes Life alert?: No Use of a cane, walker or w/c?: No Grab bars in the bathroom?: Yes Shower chair or bench in shower?: No Elevated toilet seat or a handicapped toilet?: No  TIMED UP AND GO:  Was the test performed?  Yes  Length of time to ambulate 10 feet: 10 sec Gait steady and fast without use of assistive device    Cognitive Function:        01/27/2024   10:12 AM 01/26/2023    9:38 AM 01/20/2022    9:59 AM 01/15/2021   10:59 AM  6CIT Screen  What Year? 0 points 0 points 0 points 0 points  What month? 0 points 0 points 0 points 0 points  What time? 0 points 0 points 0 points 0 points  Count back from 20 0 points 0 points 0 points 0 points  Months in reverse 0 points 0 points 0 points 0 points  Repeat phrase 0 points 0 points 2 points 0 points  Total Score 0 points 0 points 2 points 0 points    Immunizations Immunization History  Administered Date(s) Administered   Fluad Quad(high Dose 65+) 03/30/2019, 05/02/2020, 04/24/2021, 05/01/2022   Fluad Trivalent(High Dose 65+) 04/23/2023   Influenza, High Dose Seasonal PF 07/08/2013, 05/13/2014, 04/23/2015, 05/05/2018   Influenza, Seasonal, Injecte, Preservative Fre 05/09/2010, 04/09/2012   Influenza,inj,Quad PF,6+ Mos 04/23/2016, 04/21/2017   Influenza-Unspecified 07/08/2013, 05/16/2014, 04/23/2015   Moderna SARS-COV2 Booster Vaccination 06/12/2021   Moderna Sars-Covid-2 Vaccination 08/16/2019, 09/26/2019, 06/12/2020, 12/10/2020, 06/12/2021   Pfizer(Comirnaty)Fall Seasonal Vaccine 12 years and older 07/16/2022, 04/23/2023   Pneumococcal Conjugate-13 04/23/2015   Pneumococcal Polysaccharide-23 12/20/2012  Zoster Recombinant(Shingrix ) 04/24/2021, 10/22/2021    TDAP status: Due, Education has been provided regarding the importance of this vaccine. Advised may receive this vaccine at local pharmacy or  Health Dept. Aware to provide a copy of the vaccination record if obtained from local pharmacy or Health Dept. Verbalized acceptance and understanding.  Flu Vaccine status: Up to date  Pneumococcal vaccine status: Up to date  Covid-19 vaccine status: Information provided on how to obtain vaccines.   Qualifies for Shingles Vaccine? Yes   Zostavax completed No   Shingrix  Completed?: Yes  Screening Tests Health Maintenance  Topic Date Due   COVID-19 Vaccine (7 - Moderna risk 2024-25 season) 10/21/2023   DEXA SCAN  10/13/2028 (Originally 08/27/1998)   INFLUENZA VACCINE  03/04/2024   Medicare Annual Wellness (AWV)  01/26/2025   Pneumococcal Vaccine: 50+ Years  Completed   Zoster Vaccines- Shingrix   Completed   Hepatitis B Vaccines  Aged Out   HPV VACCINES  Aged Out   Meningococcal B Vaccine  Aged Out   DTaP/Tdap/Td  Discontinued    Health Maintenance  Health Maintenance Due  Topic Date Due   COVID-19 Vaccine (7 - Moderna risk 2024-25 season) 10/21/2023    Colorectal cancer screening: No longer required.   Mammogram status: No longer required due to age.   Lung Cancer Screening: (Low Dose CT Chest recommended if Age 55-80 years, 20 pack-year currently smoking OR have quit w/in 15years.) does not qualify.   Lung Cancer Screening Referral:   Additional Screening:  Hepatitis C Screening: does not qualify; Completed   Vision Screening: Recommended annual ophthalmology exams for early detection of glaucoma and other disorders of the eye. Is the patient up to date with their annual eye exam?  Yes  Who is the provider or what is the name of the office in which the patient attends annual eye exams? [facility]  If pt is not established with a provider, would they like to be referred to a provider to establish care? N/a.   Dental Screening: Recommended annual dental exams for proper oral hygiene   Community Resource Referral / Chronic Care Management: CRR required this visit?  No    CCM required this visit?  No     Plan:     I have personally reviewed and noted the following in the patient's chart:   Medical and social history Use of alcohol, tobacco or illicit drugs  Current medications and supplements including opioid prescriptions. Patient is not currently taking opioid prescriptions. Functional ability and status Nutritional status Physical activity Advanced directives List of other physicians Hospitalizations # 0 , surgeries # 0, and ER # 1 visits in previous 12 months Vitals Screenings to include cognitive, depression, and falls Referrals and appointments  In addition, I have reviewed and discussed with patient certain preventive protocols, quality metrics, and best practice recommendations. A written personalized care plan for preventive services as well as general preventive health recommendations were provided to patient.     Bonny Jon Mayor, CMA   01/27/2024   After Visit Summary: (In Person-Printed) AVS printed and given to the patient  Nurse Notes:    Alexandra Barber is a 89 y.o. female patient of Willo Mini, NP who had a Medicare Annual Wellness Visit today. Alexandra Barber is Retired and lives with their spouse. She had one adopted child who has passed away. she reports that she is socially active and does interact with friends/family regularly. 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

## 2024-05-09 ENCOUNTER — Ambulatory Visit: Admitting: Medical-Surgical

## 2024-05-09 ENCOUNTER — Encounter: Payer: Self-pay | Admitting: Medical-Surgical

## 2024-05-09 VITALS — BP 100/61 | HR 99 | Temp 99.1°F | Resp 18 | Ht 62.0 in | Wt 100.4 lb

## 2024-05-09 DIAGNOSIS — I10 Essential (primary) hypertension: Secondary | ICD-10-CM

## 2024-05-09 DIAGNOSIS — M1711 Unilateral primary osteoarthritis, right knee: Secondary | ICD-10-CM

## 2024-05-09 DIAGNOSIS — E782 Mixed hyperlipidemia: Secondary | ICD-10-CM

## 2024-05-09 NOTE — Assessment & Plan Note (Signed)
 Blood pressure well-controlled at 100/61 mmHg. Taking lisinopril  2.5mg  daily prn with very infrequent dosing. Stays regularly active with dancing.  - Continue lisinopril  prn.  - Checking labs today.

## 2024-05-09 NOTE — Assessment & Plan Note (Signed)
 Intermittent pain with occasional soreness and catching. No activity limitation. Prefers to avoid taking medications for pain. - Ok to use tylenol/ibuprofen as needed for severe pain.

## 2024-05-09 NOTE — Progress Notes (Signed)
 Established patient visit   History of Present Illness   Discussed the use of AI scribe software for clinical note transcription with the patient, who gave verbal consent to proceed.  History of Present Illness   Alexandra Barber is a 89 year old female with hypertension and hyperlipidemia who presents for follow-up on blood pressure and cholesterol management.  Hypertension and blood pressure management - Blood pressure remains stable with consistent good control over the past year - Monitors blood pressure regularly - Has only needed to take lisinopril  2.5mg  about 5 times in the last year - Mindful of sodium intake and avoids cooking with salt - Still dancing regularly  Hyperlipidemia and lipid management - Continues pravastatin  and fish oil for cholesterol management  Recent infectious symptoms - Recent fever and cough, both resolved - Occasional sneezing - No chest pain, shortness of breath, dizziness, or headaches  Right knee osteoarthritis - Occasional knee soreness - Knee soreness does not hinder activities, including dancing  Functional status and activity level - Remains active and dances regularly - Preparing for a preaching engagement - Occasionally drinks cookie dough flavored protein shakes     Physical Exam   Physical Exam Vitals reviewed.  Constitutional:      General: She is not in acute distress.    Appearance: Normal appearance.  HENT:     Head: Normocephalic and atraumatic.  Cardiovascular:     Rate and Rhythm: Normal rate and regular rhythm.     Pulses: Normal pulses.     Heart sounds: Normal heart sounds. No murmur heard.    No friction rub. No gallop.  Pulmonary:     Effort: Pulmonary effort is normal. No respiratory distress.     Breath sounds: Normal breath sounds. No wheezing.  Skin:    General: Skin is warm and dry.  Neurological:     Mental Status: She is alert and oriented to person, place, and time.  Psychiatric:         Mood and Affect: Mood normal.        Behavior: Behavior normal.        Thought Content: Thought content normal.        Judgment: Judgment normal.    Assessment & Plan   Problem List Items Addressed This Visit       Cardiovascular and Mediastinum   Hypertension - Primary   Blood pressure well-controlled at 100/61 mmHg. Taking lisinopril  2.5mg  daily prn with very infrequent dosing. Stays regularly active with dancing.  - Continue lisinopril  prn.  - Checking labs today.       Relevant Orders   CMP14+EGFR   Lipid panel   CBC     Musculoskeletal and Integument   Primary osteoarthritis of right knee   Intermittent pain with occasional soreness and catching. No activity limitation. Prefers to avoid taking medications for pain. - Ok to use tylenol/ibuprofen as needed for severe pain.         Other   Hyperlipidemia   Compliant with pravastatin  and Lovaza regimen. Refills available. - Continue pravastatin  and fish oil as prescribed. - Checking lipids today.       Relevant Orders   CMP14+EGFR   Lipid panel   General Health Maintenance Due for flu vaccine. Plans to receive flu and COVID vaccines in two weeks post-illness. - Schedule nurse visit for flu and COVID vaccines in two weeks when symptom-free. - Notify clinic if vaccines are received elsewhere to update records.  Follow up   Return in about 6 months (around 11/07/2024). __________________________________ Zada FREDRIK Palin, DNP, APRN, FNP-BC Primary Care and Sports Medicine [facility] [place]

## 2024-05-09 NOTE — Assessment & Plan Note (Signed)
 Compliant with pravastatin  and Lovaza regimen. Refills available. - Continue pravastatin  and fish oil as prescribed. - Checking lipids today.

## 2024-05-10 ENCOUNTER — Ambulatory Visit: Payer: Self-pay | Admitting: Medical-Surgical

## 2024-05-10 LAB — CMP14+EGFR
ALT: 11 IU/L (ref 0–32)
AST: 26 IU/L (ref 0–40)
Albumin: 4.3 g/dL (ref 3.6–4.6)
Alkaline Phosphatase: 45 IU/L — ABNORMAL LOW (ref 48–129)
BUN/Creatinine Ratio: 14 (ref 12–28)
BUN: 11 mg/dL (ref 10–36)
Bilirubin Total: 0.5 mg/dL (ref 0.0–1.2)
CO2: 20 mmol/L (ref 20–29)
Calcium: 9.1 mg/dL (ref 8.7–10.3)
Chloride: 99 mmol/L (ref 96–106)
Creatinine, Ser: 0.79 mg/dL (ref 0.57–1.00)
Globulin, Total: 2.8 g/dL (ref 1.5–4.5)
Glucose: 86 mg/dL (ref 70–99)
Potassium: 3.9 mmol/L (ref 3.5–5.2)
Sodium: 136 mmol/L (ref 134–144)
Total Protein: 7.1 g/dL (ref 6.0–8.5)
eGFR: 71 mL/min/1.73 (ref >59)

## 2024-05-10 LAB — CBC
Hematocrit: 33.6 % — ABNORMAL LOW (ref 34.0–46.6)
Hemoglobin: 10.6 g/dL — ABNORMAL LOW (ref 11.1–15.9)
MCH: 26.8 pg (ref 26.6–33.0)
MCHC: 31.5 g/dL (ref 31.5–35.7)
MCV: 85 fL (ref 79–97)
Platelets: 186 x10E3/uL (ref 150–450)
RBC: 3.95 x10E6/uL (ref 3.77–5.28)
RDW: 13.6 % (ref 11.7–15.4)
WBC: 4.8 x10E3/uL (ref 3.4–10.8)

## 2024-05-10 LAB — LIPID PANEL
Chol/HDL Ratio: 2.8 ratio (ref 0.0–4.4)
Cholesterol, Total: 210 mg/dL — ABNORMAL HIGH (ref 100–199)
HDL: 75 mg/dL (ref >39)
LDL Chol Calc (NIH): 124 mg/dL — ABNORMAL HIGH (ref 0–99)
Triglycerides: 64 mg/dL (ref 0–149)
VLDL Cholesterol Cal: 11 mg/dL (ref 5–40)

## 2024-05-23 ENCOUNTER — Ambulatory Visit (INDEPENDENT_AMBULATORY_CARE_PROVIDER_SITE_OTHER)

## 2024-05-23 DIAGNOSIS — Z23 Encounter for immunization: Secondary | ICD-10-CM | POA: Diagnosis not present

## 2024-07-05 DIAGNOSIS — H5203 Hypermetropia, bilateral: Secondary | ICD-10-CM | POA: Diagnosis not present

## 2024-11-07 ENCOUNTER — Ambulatory Visit: Admitting: Medical-Surgical

## 2025-01-31 ENCOUNTER — Ambulatory Visit
